# Patient Record
Sex: Female | Born: 1960 | Race: Black or African American | Hispanic: No | Marital: Single | Smoking: Former smoker
Health system: Southern US, Community
[De-identification: ages and names within clinical notes are randomized; demographics above are authoritative.]

## PROBLEM LIST (undated history)

## (undated) ENCOUNTER — Emergency Department (HOSPITAL_COMMUNITY): Discharge status: Against Medical Advice | Payer: PRIVATE HEALTH INSURANCE

## (undated) DIAGNOSIS — K219 Gastro-esophageal reflux disease without esophagitis: Secondary | ICD-10-CM

## (undated) DIAGNOSIS — R112 Nausea with vomiting, unspecified: Secondary | ICD-10-CM

## (undated) DIAGNOSIS — B179 Acute viral hepatitis, unspecified: Secondary | ICD-10-CM

## (undated) DIAGNOSIS — R011 Cardiac murmur, unspecified: Secondary | ICD-10-CM

## (undated) DIAGNOSIS — G932 Benign intracranial hypertension: Secondary | ICD-10-CM

## (undated) DIAGNOSIS — Z9889 Other specified postprocedural states: Secondary | ICD-10-CM

## (undated) DIAGNOSIS — I10 Essential (primary) hypertension: Secondary | ICD-10-CM

## (undated) DIAGNOSIS — J189 Pneumonia, unspecified organism: Secondary | ICD-10-CM

## (undated) DIAGNOSIS — R51 Headache: Secondary | ICD-10-CM

## (undated) DIAGNOSIS — R519 Headache, unspecified: Secondary | ICD-10-CM

## (undated) DIAGNOSIS — M1990 Unspecified osteoarthritis, unspecified site: Secondary | ICD-10-CM

## (undated) DIAGNOSIS — Z87442 Personal history of urinary calculi: Secondary | ICD-10-CM

## (undated) HISTORY — PX: BRAIN SURGERY: SHX531

## (undated) HISTORY — PX: COLECTOMY: SHX59

---
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
## 1986-07-07 HISTORY — PX: CYST EXCISION: SHX5701

## 1992-07-07 HISTORY — PX: TUBAL LIGATION: SHX77

## 1997-07-07 HISTORY — PX: ABDOMINAL HYSTERECTOMY: SHX81

## 2013-10-22 ENCOUNTER — Emergency Department (HOSPITAL_COMMUNITY)
Admission: EM | Admit: 2013-10-22 | Discharge: 2013-10-23 | Disposition: A | Discharge status: Home / Self Care | Payer: PRIVATE HEALTH INSURANCE | Attending: Emergency Medicine | Admitting: Emergency Medicine

## 2013-10-22 ENCOUNTER — Encounter (HOSPITAL_COMMUNITY): Payer: Self-pay | Admitting: Emergency Medicine

## 2013-10-22 DIAGNOSIS — G932 Benign intracranial hypertension: Secondary | ICD-10-CM | POA: Insufficient documentation

## 2013-10-22 DIAGNOSIS — Z7902 Long term (current) use of antithrombotics/antiplatelets: Secondary | ICD-10-CM | POA: Insufficient documentation

## 2013-10-22 DIAGNOSIS — Z9889 Other specified postprocedural states: Secondary | ICD-10-CM | POA: Insufficient documentation

## 2013-10-22 DIAGNOSIS — R51 Headache: Secondary | ICD-10-CM | POA: Insufficient documentation

## 2013-10-22 DIAGNOSIS — Z79899 Other long term (current) drug therapy: Secondary | ICD-10-CM | POA: Insufficient documentation

## 2013-10-22 DIAGNOSIS — R519 Headache, unspecified: Secondary | ICD-10-CM

## 2013-10-22 DIAGNOSIS — Z7982 Long term (current) use of aspirin: Secondary | ICD-10-CM | POA: Insufficient documentation

## 2013-10-22 HISTORY — DX: Benign intracranial hypertension: G93.2

## 2013-10-22 LAB — CBC
HCT: 38.1 % (ref 36.0–46.0)
HEMOGLOBIN: 12.9 g/dL (ref 12.0–15.0)
MCH: 31.1 pg (ref 26.0–34.0)
MCHC: 33.9 g/dL (ref 30.0–36.0)
MCV: 91.8 fL (ref 78.0–100.0)
PLATELETS: 307 10*3/uL (ref 150–400)
RBC: 4.15 MIL/uL (ref 3.87–5.11)
RDW: 13.2 % (ref 11.5–15.5)
WBC: 5.3 10*3/uL (ref 4.0–10.5)

## 2013-10-22 LAB — I-STAT CHEM 8, ED
BUN: 9 mg/dL (ref 6–23)
CHLORIDE: 106 meq/L (ref 96–112)
CREATININE: 0.5 mg/dL (ref 0.50–1.10)
Calcium, Ion: 1.14 mmol/L (ref 1.12–1.23)
Glucose, Bld: 105 mg/dL — ABNORMAL HIGH (ref 70–99)
HCT: 41 % (ref 36.0–46.0)
Hemoglobin: 13.9 g/dL (ref 12.0–15.0)
POTASSIUM: 3.3 meq/L — AB (ref 3.7–5.3)
Sodium: 141 mEq/L (ref 137–147)
TCO2: 22 mmol/L (ref 0–100)

## 2013-10-22 MED ORDER — DEXAMETHASONE SODIUM PHOSPHATE 4 MG/ML IJ SOLN
10.0000 mg | Freq: Once | INTRAMUSCULAR | Status: AC
  Administered 2013-10-22: 10 mg via INTRAVENOUS
  Filled 2013-10-22: qty 3

## 2013-10-22 MED ORDER — METOCLOPRAMIDE HCL 5 MG/ML IJ SOLN
10.0000 mg | Freq: Once | INTRAMUSCULAR | Status: AC
  Administered 2013-10-22: 10 mg via INTRAVENOUS
  Filled 2013-10-22: qty 2

## 2013-10-22 MED ORDER — ACETAZOLAMIDE ER 500 MG PO CP12: 1500.0000 mg | ORAL_CAPSULE | Freq: Two times a day (BID) | ORAL | Status: DC

## 2013-10-22 MED ORDER — DIPHENHYDRAMINE HCL 50 MG/ML IJ SOLN
25.0000 mg | Freq: Once | INTRAMUSCULAR | Status: AC
  Administered 2013-10-22: 25 mg via INTRAVENOUS
  Filled 2013-10-22: qty 1

## 2013-10-22 NOTE — ED Provider Notes (Signed)
CSN: [id number]     Arrival date & time 10/22/13  2035 History   First MD Initiated Contact with Patient 10/22/13 2117     Chief Complaint  Patient presents with  . Headache     (Consider location/radiation/quality/duration/timing/severity/associated sxs/prior Treatment) HPI  This is a 53 y.o. 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 Onset this afternoon. Located right frontal head. Persistent, worsening. Throbbing. Not alleviated with Tylenol. Radiating to right occipital area. Aggravated with ranging of the head. Patient denies vision change, change in speech, fever, rash, weakness, numbness, or tingling, or change in gait.  Past Medical History  Diagnosis Date  . Pseudotumor cerebri    Past Surgical History  Procedure Laterality Date  . Brain surgery     No family history on file. History  Substance Use Topics  . Smoking status: Never Smoker   . Smokeless tobacco: Not on file  . Alcohol Use: No   OB History   Grav Para Term Preterm Abortions TAB SAB Ect Mult Living                 Review of Systems  Constitutional: Negative for fever and chills.  HENT: Negative for facial swelling.   Eyes: Negative for photophobia and pain.  Respiratory: Negative for cough and shortness of breath.   Cardiovascular: Negative for chest pain and leg swelling.  Gastrointestinal: Negative for nausea, vomiting and abdominal pain.  Genitourinary: Negative for dysuria.  Musculoskeletal: Negative for arthralgias.  Skin: Negative for rash and wound.  Neurological: Positive for headaches. Negative for dizziness, seizures, syncope, facial asymmetry, speech difficulty, weakness, light-headedness and numbness.  Hematological: Negative for adenopathy.      Allergies  Cephalosporins; Magnesium-containing compounds; Strawberry;  Verapamil; Aleve; and Codeine  Home Medications   Prior to Admission medications   Medication Sig Start Date End Date Taking? Authorizing Provider  acetaZOLAMIDE (DIAMOX) 500 MG capsule Take 1,500 mg by mouth 2 (two) times daily.   Yes Historical Provider, MD  aspirin 325 MG tablet Take 325 mg by mouth daily.   Yes Historical Provider, MD  candesartan (ATACAND) 16 MG tablet Take 16 mg by mouth daily.   Yes Historical Provider, MD  clopidogrel (PLAVIX) 75 MG tablet Take 75 mg by mouth daily with breakfast.   Yes Historical Provider, MD  levothyroxine (SYNTHROID, LEVOTHROID) 88 MCG tablet Take 88 mcg by mouth daily before breakfast.   Yes Historical Provider, MD   BP 127/80  Pulse 80  Temp(Src) 98.5 F (36.9 C) (Oral)  Resp 18  SpO2 99% Physical Exam  Constitutional: She is oriented to person, place, and time. She appears well-developed and well-nourished. No distress.  HENT:  Head: Normocephalic and atraumatic.  Mouth/Throat: No oropharyngeal exudate.  Eyes: Conjunctivae are normal. Pupils are equal, round, and reactive to light. No scleral icterus.  Neck: Normal range of motion. No tracheal deviation present. No thyromegaly present.  Cardiovascular: Normal rate, regular rhythm and normal heart sounds.  Exam reveals no gallop and no friction rub.   No murmur heard. Pulmonary/Chest: Effort normal and breath sounds normal. No stridor. No respiratory distress. She has no wheezes. She has no rales. She exhibits no tenderness.  Abdominal: Soft. She exhibits no distension and no mass. There is no tenderness. There is no rebound and no guarding.  Musculoskeletal: Normal range of motion. She exhibits no edema.  Neurological: She is alert and oriented to person, place, and time. She has normal strength. She displays no atrophy and no tremor. No cranial nerve deficit or sensory deficit. She exhibits normal muscle tone. She displays no seizure activity. Coordination and gait normal. GCS eye  subscore is 4. GCS verbal subscore is 5. GCS motor subscore is 6.  Reflex Scores:      Patellar reflexes are 2+ on the right side and 2+ on the left side. Skin: Skin is warm and dry. She is not diaphoretic.    ED Course  Procedures (including critical care time) Labs Review Labs Reviewed  I-STAT CHEM 8, ED - Abnormal; Notable for the following:    Potassium 3.3 (*)    Glucose, Bld 105 (*)    All other components within normal limits  CBC    MDM   Final diagnoses:  None    This is a 53 y.o. 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 Onset this afternoon. Located right frontal head. Persistent, worsening. Throbbing. Not alleviated with Tylenol. Radiating to right occipital area. Aggravated with ranging of the head. Patient denies vision change, change in speech, fever, rash, weakness, numbness, or tingling, or change in gait.  Examination is within normal limits. Specifically, patient has no meningeal signs. She has no neuro deficits. She states that she has been on Plavix, aspirin since stent placement. Physicians have instructed her not to receive lumbar puncture while she is on these medications. Physicians have instructed her to "bear it" when she has headaches. She is scheduled to [facility] in 3 weeks to discuss placement of shunt.  At this time, I do not believe that headache represents meningitis, intracranial hemorrhage, thrombosis, dissection, aneurysm, or any other concerning etiology. She has no indication for emergent lumbar puncture at this time. Will administer Reglan, Benadryl, Decadron, continue to monitor patient closely, reevaluate shortly. The patient remains stable.  Patient endorses alleviation, but not resolution. She is now revealed to me that she has not been taking her Diamox  for 3 weeks. She is unable to afford the prescription (she states it is $600 for a month's prescription). I believe that this is likely part of the explanation of patient's worsening headache. I called social work for assistance in providing patient with resources and financial assistance with her prescription.  They have referred me to care management. I have spoken with Raynelle FanningJulie care management who will arrange for match letter to be [facility] [address] tomorrow morning. I provided the patient with another prescription for Diamox indicates that she no longer has hers. I've instructed her to present to the [facility] midday tomorrow in order to fill her prescription.  Pt stable for discharge, FU.  All questions answered.  Return precautions given.  I have discussed case and care has been guided by my attending physician, Dr. Fonnie JarvisBednar.    Loma BostonStirling Geselle Cardosa, MD 10/23/13 [phone]

## 2013-10-22 NOTE — ED Notes (Signed)
Patient with headache since this morning.  Patient states that it has been getting worse during the day.  Patient is very tearful in triage.  Patient states she did have brain surgery 6 months ago for a stent placement.  Patient denies any nausea or vomiting at this time.

## 2013-10-22 NOTE — ED Notes (Signed)
PA at bedside.

## 2013-10-22 NOTE — Discharge Instructions (Signed)
THE MATCH LETTER IS BEING FAXED TO THE [facility] ON [address].  PRESENT THERE MID DAY TOMORROW TO FILL PRESCRIPTION.  Idiopathic Intracranial Hypertension  Idiopathic intracranial hypertension (IIH) is a neurologic disorder that leads to increased pressure around your brain. It can cause vision loss and blindness if left untreated. RISK FACTORS IIH is most common in very overweight (obese) women of childbearing age. SIGNS AND SYMPTOMS  Symptoms of IIH include:  Headache.  Feeling of sickness in your stomach (nausea).  Vomiting.  A "rushing of water" sound within your ears (pulsatile tinnitus).  Double vision. DIAGNOSIS  Idiopathic intracranial hypertension is diagnosed with the aid of different exams:  Brain scans such as:  CT.  MRI.  MRV.  Diagnostic lumbar puncture. This procedure can determine if there is too much spinal fluid within the central nervous system. Too much spinal fluid can increase intracranial pressure.  A thorough eye exam will be done to look for swelling within the eyes. Visual field testing will also be done to see if any damage has occurred to nerves in the eyes. TREATMENT  Treatment of idiopathic intracranial hypertension is based on symptoms. Common treatments include:  Lumbar puncture to remove excess spinal fluid.  Medicine.  Surgery. HOME CARE INSTRUCTIONS: The most important thing anyone can do to improve this condition is lose weight if they are overweight.  SEEK MEDICAL CARE IF:  You have changes in vision.  You have double vision.  You have loss of color vision. SEEK IMMEDIATE MEDICAL CARE IF:  You experience any of the following, such as:  Your headaches get worse rather than better.  Nausea or vomiting or both continue after treatment.  Your vision does not improve or gets worse after treatment. MAKE SURE YOU:  Understand these instructions.  Will watch your condition.  Will get help right away if you are not doing well or  get worse. Document Released: 09/01/2001 Document Revised: 04/13/2013 Document Reviewed: 02/28/2013 [facility] Patient Information 2014 [employer] [place].

## 2013-10-23 NOTE — Progress Notes (Signed)
Case Manager spoke with Floor CM Freddy JakschSarah Jeffries regarding patients prescription.[facility] reports the cost of the Diamox medication exceeds the [facility].  CM spoke with Dr Rubin PayorPickering regarding the order no changes were able to be made.CM liaised  with MD regarding patients plan.CM to call pharmacist to see if they can do a partial Medication assist today using patients MATCH, pharmacist reports per [facility] she can cover 120 tablets for $495.CM clarified no further prescriptions were needed. Katrina Pharmacist reports she has the Diamox prescription and had no other needs.

## 2013-10-23 NOTE — Progress Notes (Signed)
CARE MANAGEMENT NOTE 10/23/2013  Patient:  Theresa Case,Theresa Case   Account Number:  [id number]  Date Initiated:  10/23/2013  Documentation initiated by:  [facility]Levy Cedano  Subjective/Objective Assessment:   ED     Action/Plan:   med asst   Anticipated DC Date:     Anticipated DC Plan:           Choice offered to / List presented to:             Status of service:  Completed, signed off Medicare Important Message given?   (If response is "NO", the following Medicare IM given date fields will be blank) Date Medicare IM given:   Date Additional Medicare IM given:    Discharge Disposition:  HOME/SELF CARE  Per UR Regulation:    If discussed at Long Length of Stay Meetings, dates discussed:    Comments:  10/23/1513:45 CM received call from pharmacist, Katrina, who states MATCH is rejected.  CM verifies all numbers with no errors.  CM speaks with pt who states she actually has insurance [employer] PPO but it is too expensive.  CM suggests she works with the pharmacist, Katrina to see how many capsules of the med she can afford and use her insurance.  CM suggests [facility] appt for a PCP.  And of course if she thinks it an emergent situation, return to the ED. No other CM needs were communicated.  Freddy JakschSarah Aeriana Speece, BSN, Cm [phone]. 13:15 CM called pt as pt called the ED and stated pharmacy didn't get the right information.  While the pt was on the phone, CM called the pharmacy, verified the information, verified the pharmacy address, and pharmacy states they have not heard from pt.  Pt states maybe she called the wrong one.  Pt verbalized understanding of MATCH parameters and understands which pharmacy she is to bring her prescriptions to today.  No other CM needs were communicated.  Freddy JakschSarah Kimmy Totten, BSN, CM [phone].  08:30 CM received call from on-call CM to [facility] and [facility] on [address] for pt to pick up meds later  this afternoon.  CM MATCHed pt and called into pharmacy, Barbara CowerJason.  No other CM needs were communicated.  Freddy JakschSarah Tattianna Schnarr, BSN, CM [phone].

## 2013-10-24 NOTE — Care Management Note (Addendum)
    Page 1 of 1   10/23/2013     4:06:00 PM CARE MANAGEMENT NOTE 10/23/2013  Patient:  Theresa Case,Theresa Case   Account Number:  [id number]  Date Initiated:  10/23/2013  Documentation initiated by:  [facility]Katoria Yetman  Subjective/Objective Assessment:   ED     Action/Plan:   med asst   Anticipated DC Date:     Anticipated DC Plan:           Choice offered to / List presented to:             Status of service:  Completed, signed off Medicare Important Message given?   (If response is "NO", the following Medicare IM given date fields will be blank) Date Medicare IM given:   Date Additional Medicare IM given:    Discharge Disposition:  HOME/SELF CARE  Per UR Regulation:    If discussed at Long Length of Stay Meetings, dates discussed:    Comments:  10/23/1513:45 CM received call from pharmacist, Katrina, who states MATCH is rejected.  CM verifies all numbers with no errors.  CM speaks with pt who states she actually has insurance [employer] [employer] PPO but it is too expensive.  CM suggests she works with the pharmacist, Katrina to see how many capsules of the med she can afford and use her insurance.  CM suggests she [facility] to [facility] an appt for a PCP.  And of course if she thinks it an emergent situation, return to the ED. No other CM needs were communicated.  Freddy JakschSarah Kamyia Thomason, BSN, Cm [phone]. 13:15 CM called pt as pt called the ED and stated pharmacy didn't get the right information.  While the pt was on the phone, CM called the pharmacy, verified the information, verified the pharmacy address, and pharmacy states they have not heard from pt.  Pt states maybe she called the wrong one.  Pt verbalized understanding of MATCH parameters and understands which pharmacy she is to bring her prescriptions to today.  No other CM needs were communicated.  Freddy JakschSarah Nizar Cutler, BSN, CM [phone].  08:30 CM received call from on-call CM to [facility] and [facility] [address] for pt to pick up meds later this afternoon.  CM MATCHed pt and called into pharmacy, Barbara CowerJason.  No other CM needs were communicated.  Freddy JakschSarah Mandisa Persinger, BSN, CM [phone].

## 2013-10-27 NOTE — ED Provider Notes (Signed)
I saw and evaluated the patient, reviewed the resident's note and I agree with the findings and plan.   EKG Interpretation None     No vision change or fever; has been instructed no LP due to meds.  Hurman HornJohn M Aveon Colquhoun, MD 10/27/13 [phone]

## 2014-12-10 DIAGNOSIS — E039 Hypothyroidism, unspecified: Secondary | ICD-10-CM | POA: Insufficient documentation

## 2014-12-10 DIAGNOSIS — G4733 Obstructive sleep apnea (adult) (pediatric): Secondary | ICD-10-CM | POA: Insufficient documentation

## 2014-12-10 DIAGNOSIS — IMO0001 Reserved for inherently not codable concepts without codable children: Secondary | ICD-10-CM | POA: Insufficient documentation

## 2016-07-01 ENCOUNTER — Emergency Department (HOSPITAL_COMMUNITY): Payer: 59

## 2016-07-01 ENCOUNTER — Emergency Department (HOSPITAL_COMMUNITY)
Admission: EM | Admit: 2016-07-01 | Discharge: 2016-07-01 | Disposition: A | Discharge status: Home / Self Care | Payer: 59 | Attending: Emergency Medicine | Admitting: Emergency Medicine

## 2016-07-01 ENCOUNTER — Encounter (HOSPITAL_COMMUNITY): Payer: Self-pay | Admitting: Emergency Medicine

## 2016-07-01 DIAGNOSIS — Z7982 Long term (current) use of aspirin: Secondary | ICD-10-CM | POA: Diagnosis not present

## 2016-07-01 DIAGNOSIS — N23 Unspecified renal colic: Secondary | ICD-10-CM | POA: Diagnosis not present

## 2016-07-01 DIAGNOSIS — R1012 Left upper quadrant pain: Secondary | ICD-10-CM | POA: Diagnosis present

## 2016-07-01 DIAGNOSIS — R109 Unspecified abdominal pain: Secondary | ICD-10-CM

## 2016-07-01 LAB — COMPREHENSIVE METABOLIC PANEL
ALK PHOS: 55 U/L (ref 38–126)
ALT: 52 U/L (ref 14–54)
ANION GAP: 11 (ref 5–15)
AST: 36 U/L (ref 15–41)
Albumin: 4.1 g/dL (ref 3.5–5.0)
BUN: 9 mg/dL (ref 6–20)
CALCIUM: 9.6 mg/dL (ref 8.9–10.3)
CO2: 17 mmol/L — AB (ref 22–32)
Chloride: 110 mmol/L (ref 101–111)
Creatinine, Ser: 0.75 mg/dL (ref 0.44–1.00)
Glucose, Bld: 105 mg/dL — ABNORMAL HIGH (ref 65–99)
Potassium: 3.7 mmol/L (ref 3.5–5.1)
SODIUM: 138 mmol/L (ref 135–145)
TOTAL PROTEIN: 7.4 g/dL (ref 6.5–8.1)
Total Bilirubin: 0.5 mg/dL (ref 0.3–1.2)

## 2016-07-01 LAB — CBC
HCT: 39.2 % (ref 36.0–46.0)
Hemoglobin: 13 g/dL (ref 12.0–15.0)
MCH: 30.6 pg (ref 26.0–34.0)
MCHC: 33.2 g/dL (ref 30.0–36.0)
MCV: 92.2 fL (ref 78.0–100.0)
PLATELETS: 302 10*3/uL (ref 150–400)
RBC: 4.25 MIL/uL (ref 3.87–5.11)
RDW: 14.1 % (ref 11.5–15.5)
WBC: 8.9 10*3/uL (ref 4.0–10.5)

## 2016-07-01 LAB — URINALYSIS, ROUTINE W REFLEX MICROSCOPIC
Bilirubin Urine: NEGATIVE
Glucose, UA: NEGATIVE mg/dL
Hgb urine dipstick: NEGATIVE
Ketones, ur: NEGATIVE mg/dL
Leukocytes, UA: NEGATIVE
NITRITE: NEGATIVE
PROTEIN: NEGATIVE mg/dL
Specific Gravity, Urine: 1.006 (ref 1.005–1.030)
pH: 9 — ABNORMAL HIGH (ref 5.0–8.0)

## 2016-07-01 LAB — LIPASE, BLOOD: Lipase: 18 U/L (ref 11–51)

## 2016-07-01 IMAGING — CT CT ABD-PELV W/ CM
2 of 5 series · 16 of 46 positions shown, 18 images · IV contrast (Omni 300)
Comparison: [DATE]

CLINICAL DATA: Left-sided abdominal pain for several hours, initial
encounter

EXAM:
CT ABDOMEN AND PELVIS WITH CONTRAST
TECHNIQUE: Multidetector CT imaging of the abdomen and pelvis was performed
using the standard protocol following bolus administration of
intravenous contrast.
CONTRAST:  100 mL [8Y].

[Series 2: a/p w/ 5mm · axial · 0.88mm/px · z∈[+726,+1141]mm · 13 of 95 slices shown, 15 images]
[im 6/95  soft-tissue]
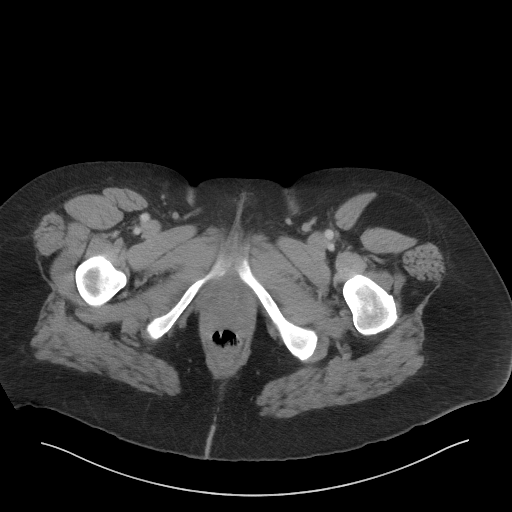
[im 6/95  bone]
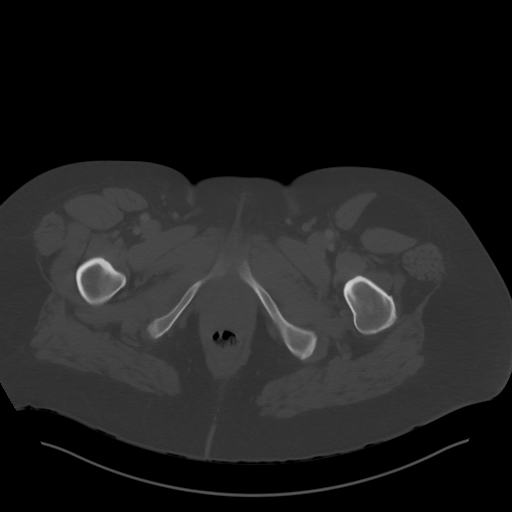
[im 12/95  soft-tissue]
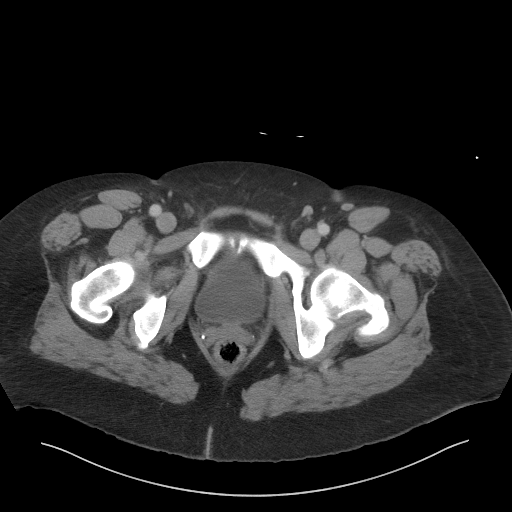
[im 23/95  soft-tissue]
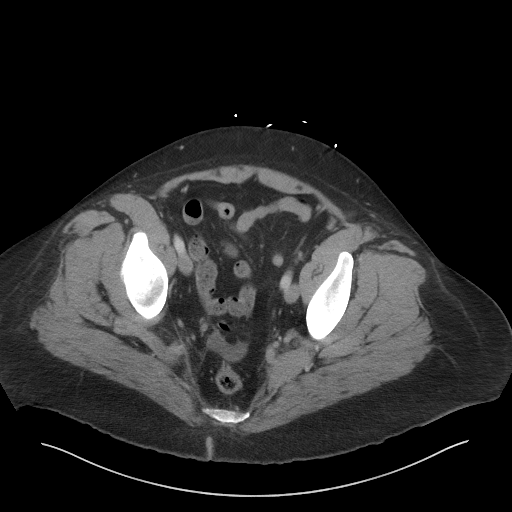
[im 28/95  soft-tissue]
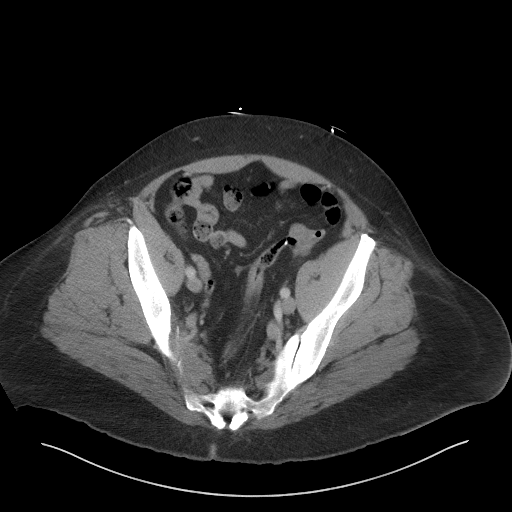
[im 34/95  soft-tissue]
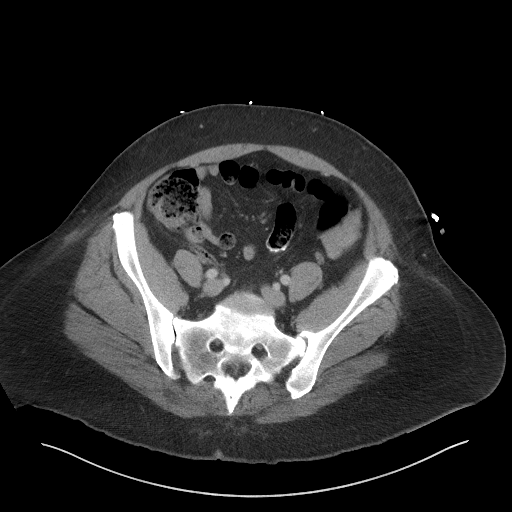
[im 39/95  soft-tissue]
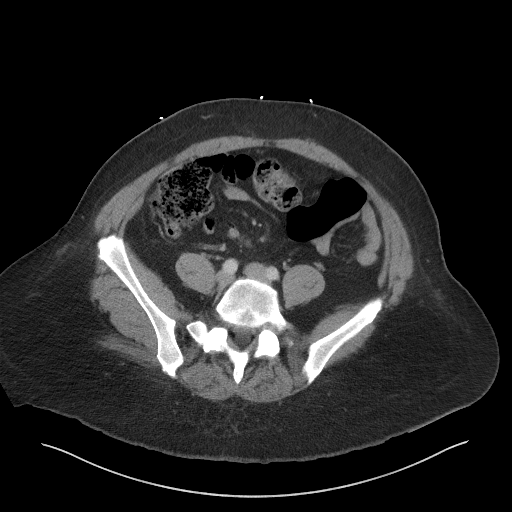
[im 50/95  soft-tissue]
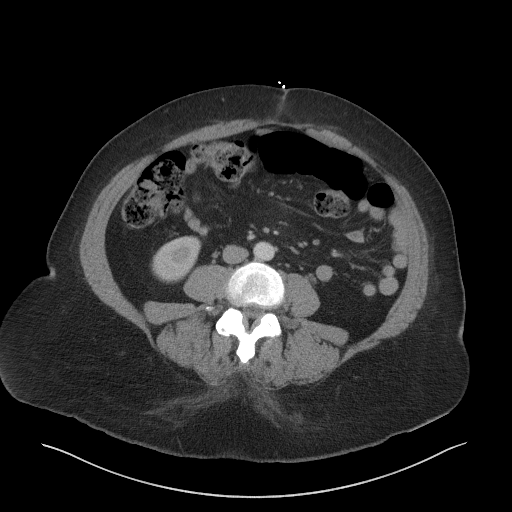
[im 56/95  soft-tissue]
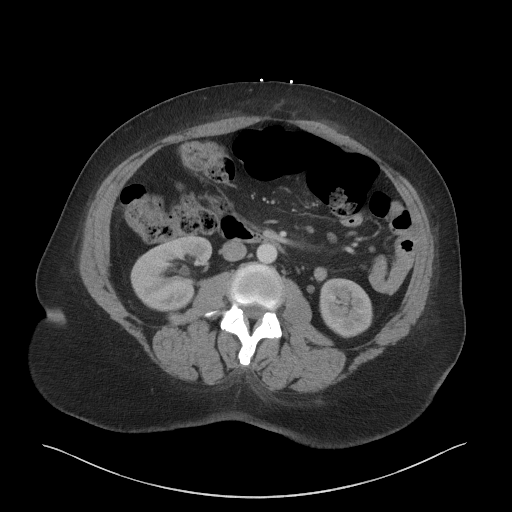
[im 61/95  soft-tissue]
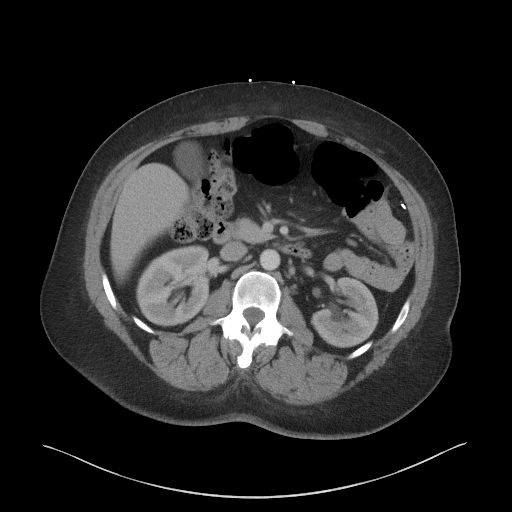
[im 61/95  bone]
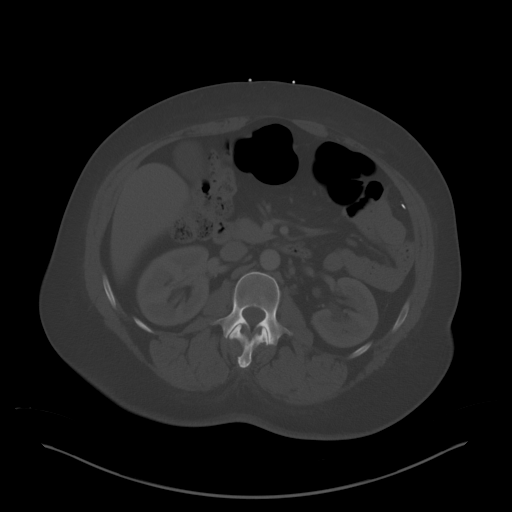
[im 67/95  soft-tissue]
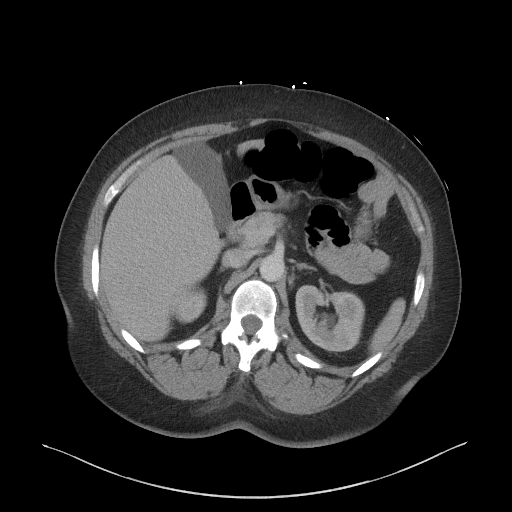
[im 72/95  soft-tissue]
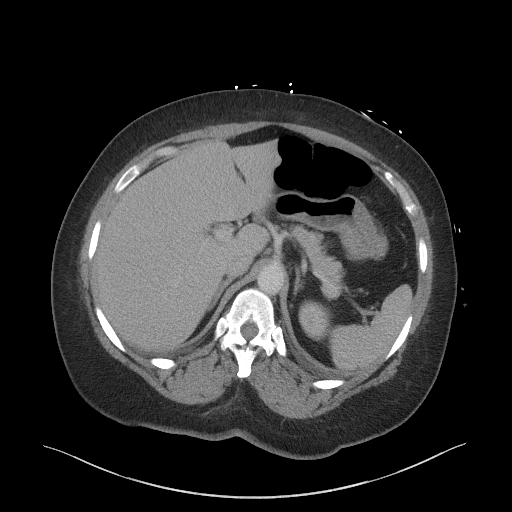
[im 83/95  soft-tissue]
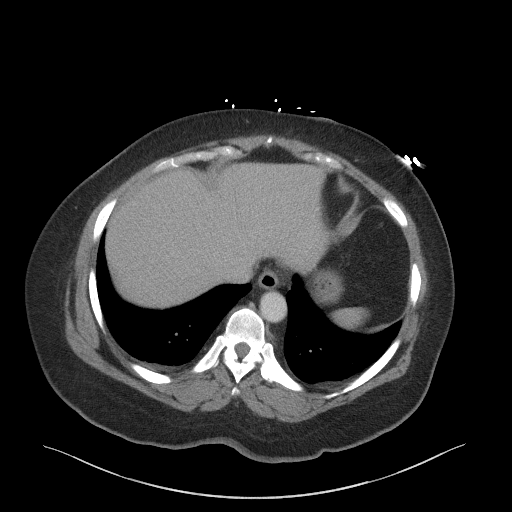
[im 89/95  soft-tissue]
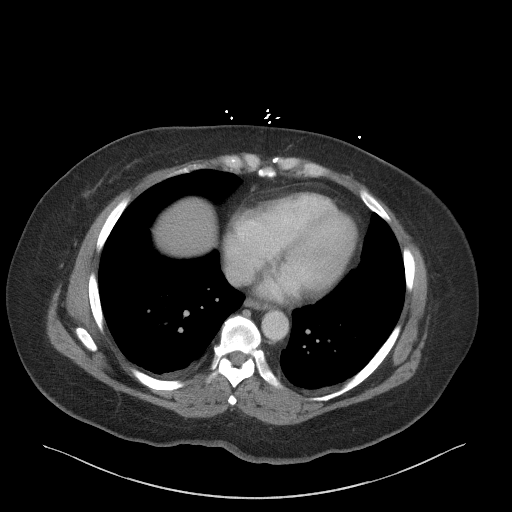

[Series 5: a/p w/ cor · coronal · 0.88mm/px · 3 of 162 slices shown]
[im 54/162  soft-tissue]
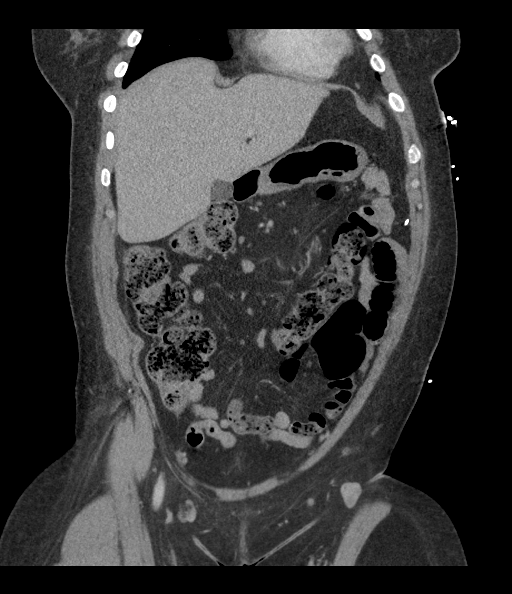
[im 72/162  soft-tissue]
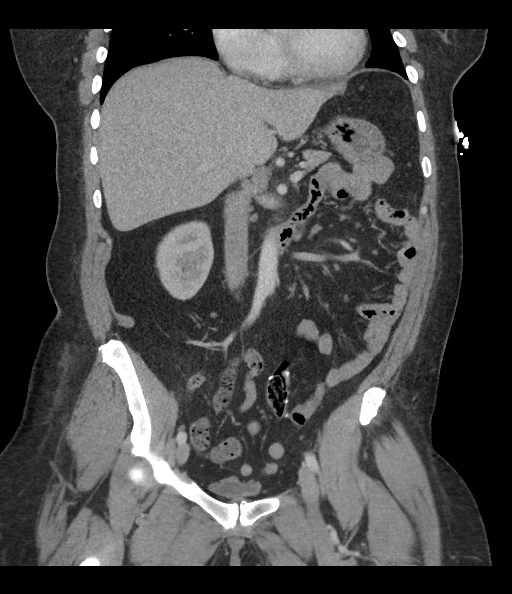
[im 90/162  soft-tissue]
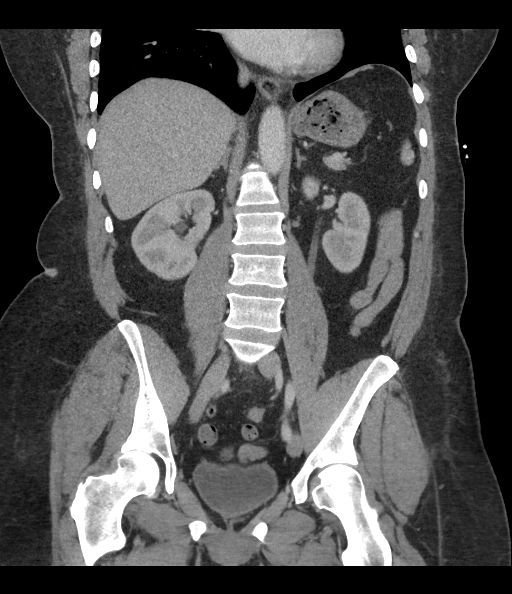

[16 of 46 positions shown; findings below may reference images not displayed]

FINDINGS: Lower chest: Minimal atelectatic changes are noted bilaterally.

Hepatobiliary: No focal liver abnormality is seen. No gallstones,
gallbladder wall thickening, or biliary dilatation.

Pancreas: Unremarkable. No pancreatic ductal dilatation or
surrounding inflammatory changes.

Spleen: Normal in size without focal abnormality.

Adrenals/Urinary Tract: The adrenal glands are within normal limits.
The right kidney is unremarkable. No definitive renal calculi are
seen. Minimal fullness of the left collecting system is noted
without definitive obstructing stone. This may be related to edema
from recently passed stone. Bladder is well distended

Stomach/Bowel: Stomach is within normal limits. Appendix appears
normal. No evidence of bowel wall thickening, distention, or
inflammatory changes. Prior sigmoid surgery is noted. The
anastomotic site is widely patent.

Vascular/Lymphatic: No significant vascular findings are present. No
enlarged abdominal or pelvic lymph nodes.

Reproductive: Status post hysterectomy. No adnexal masses.

Other: No abdominal wall hernia or abnormality. No abdominopelvic
ascites.

Musculoskeletal: No acute or significant osseous findings.
IMPRESSION: Minimal fullness of the left collecting system. Given the patient's
history this may be related to edema from recently passed stone.

No other focal abnormality is noted.

## 2016-07-01 MED ORDER — ONDANSETRON HCL 4 MG/2ML IJ SOLN
4.0000 mg | Freq: Once | INTRAMUSCULAR | Status: AC
  Administered 2016-07-01: 4 mg via INTRAVENOUS
  Filled 2016-07-01: qty 2

## 2016-07-01 MED ORDER — TRAMADOL HCL 50 MG PO TABS: 50.0000 mg | ORAL_TABLET | Freq: Four times a day (QID) | ORAL | 0 refills | Status: DC | PRN

## 2016-07-01 MED ORDER — KETOROLAC TROMETHAMINE 30 MG/ML IJ SOLN
30.0000 mg | Freq: Once | INTRAMUSCULAR | Status: AC
  Administered 2016-07-01: 30 mg via INTRAVENOUS
  Filled 2016-07-01: qty 1

## 2016-07-01 MED ORDER — KETOROLAC TROMETHAMINE 15 MG/ML IJ SOLN
15.0000 mg | Freq: Once | INTRAMUSCULAR | Status: AC
  Administered 2016-07-01: 15 mg via INTRAVENOUS
  Filled 2016-07-01: qty 1

## 2016-07-01 MED ORDER — IOPAMIDOL (ISOVUE-300) INJECTION 61%
INTRAVENOUS | Status: AC
  Administered 2016-07-01: 100 mL
  Filled 2016-07-01: qty 100

## 2016-07-01 MED ORDER — MORPHINE SULFATE (PF) 4 MG/ML IV SOLN
4.0000 mg | Freq: Once | INTRAVENOUS | Status: AC
  Administered 2016-07-01: 4 mg via INTRAVENOUS
  Filled 2016-07-01: qty 1

## 2016-07-01 MED ORDER — DIPHENHYDRAMINE HCL 50 MG/ML IJ SOLN
25.0000 mg | Freq: Once | INTRAMUSCULAR | Status: AC
  Administered 2016-07-01: 25 mg via INTRAVENOUS
  Filled 2016-07-01: qty 1

## 2016-07-01 MED ORDER — LORAZEPAM 2 MG/ML IJ SOLN
1.0000 mg | Freq: Once | INTRAMUSCULAR | Status: AC
  Administered 2016-07-01: 1 mg via INTRAVENOUS
  Filled 2016-07-01: qty 1

## 2016-07-01 MED ORDER — ONDANSETRON 4 MG PO TBDP: 4.0000 mg | ORAL_TABLET | Freq: Three times a day (TID) | ORAL | 0 refills | Status: DC | PRN

## 2016-07-01 NOTE — ED Triage Notes (Signed)
Patient woke up this morning with mid/LUQ pain . Denies emesis or diarrhea , no fever or chills .

## 2016-07-01 NOTE — ED Provider Notes (Signed)
[facility]-EMERGENCY DEPT Provider Note   CSN: [id number] Arrival date & time: 07/01/16  0601     History   Chief Complaint Chief Complaint  Patient presents with  . Abdominal Pain    HPI Djeneba Felix AhmadiStaten is a 55 y.o. female.  The history is provided by the patient and medical records.    55 year old female with history of pseudotumor cerebri, presenting to the ED for abdominal pain. Reports this began early this morning and woke her from sleep. Pain is localized to her mid and upper left abdomen. States pain is severe. States it "feels like she swallowed a basketball". She's not had a bowel movement in a few days but reports this is not necessarily abnormal for her. She's not had any nausea or vomiting. No abnormal food intake. No recent travel.  Prior abdominal surgeries include partial colectomy and hysterectomy. She denies any current headache, neck pain, or fever. No urinary symptoms. No pelvic complaints.  Past Medical History:  Diagnosis Date  . Pseudotumor cerebri     There are no active problems to display for this patient.   Past Surgical History:  Procedure Laterality Date  . BRAIN SURGERY      OB History    No data available       Home Medications    Prior to Admission medications   Medication Sig Start Date End Date Taking? Authorizing Provider  acetaZOLAMIDE (DIAMOX) 500 MG capsule Take 1,500 mg by mouth 2 (two) times daily.    Historical Provider, MD  acetaZOLAMIDE (DIAMOX) 500 MG capsule Take 3 capsules (1,500 mg total) by mouth 2 (two) times daily. 10/22/13   Loma BostonStirling Harper, MD  aspirin 325 MG tablet Take 325 mg by mouth daily.    Historical Provider, MD  candesartan (ATACAND) 16 MG tablet Take 16 mg by mouth daily.    Historical Provider, MD  clopidogrel (PLAVIX) 75 MG tablet Take 75 mg by mouth daily with breakfast.    Historical Provider, MD  levothyroxine (SYNTHROID, LEVOTHROID) 88 MCG tablet Take 88 mcg by mouth daily before breakfast.    Historical  Provider, MD    Family History No family history on file.  Social History Social History  Substance Use Topics  . Smoking status: Never Smoker  . Smokeless tobacco: Never Used  . Alcohol use No     Allergies   Cephalosporins; Magnesium-containing compounds; Strawberry extract; Verapamil; Aleve [naproxen]; and Codeine   Review of Systems Review of Systems  Gastrointestinal: Positive for abdominal pain.  All other systems reviewed and are negative.    Physical Exam Updated Vital Signs BP 140/91 (BP Location: Right Arm)   Pulse 71   Temp 98.8 F (37.1 C) (Oral)   Resp 22   SpO2 99%   Physical Exam  Constitutional: She is oriented to person, place, and time. She appears well-developed and well-nourished.  Appears uncomfortable, moaning during exam  HENT:  Head: Normocephalic and atraumatic.  Mouth/Throat: Oropharynx is clear and moist.  Eyes: Conjunctivae and EOM are normal. Pupils are equal, round, and reactive to light.  Neck: Normal range of motion.  Cardiovascular: Normal rate, regular rhythm and normal heart sounds.   Pulmonary/Chest: Effort normal and breath sounds normal.  Abdominal: Soft. Bowel sounds are normal. There is tenderness in the left upper quadrant and left lower quadrant.  Pain with any palpation on left side of abdomen  Musculoskeletal: Normal range of motion.  Neurological: She is alert and oriented to person, place, and time.  Skin: Skin  is warm and dry.  Psychiatric: She has a normal mood and affect.  Nursing note and vitals reviewed.    ED Treatments / Results  Labs (all labs ordered are listed, but only abnormal results are displayed) Labs Reviewed  COMPREHENSIVE METABOLIC PANEL - Abnormal; Notable for the following:       Result Value   CO2 17 (*)    Glucose, Bld 105 (*)    All other components within normal limits  URINALYSIS, ROUTINE W REFLEX MICROSCOPIC - Abnormal; Notable for the following:    Color, Urine COLORLESS (*)     pH 9.0 (*)    All other components within normal limits  LIPASE, BLOOD  CBC    EKG  EKG Interpretation None       Radiology Ct Abdomen Pelvis W Contrast  Result Date: 07/01/2016 CLINICAL DATA:  Left-sided abdominal pain for several hours, initial encounter EXAM: CT ABDOMEN AND PELVIS WITH CONTRAST TECHNIQUE: Multidetector CT imaging of the abdomen and pelvis was performed using the standard protocol following bolus administration of intravenous contrast. CONTRAST:  100 mL Isovue-300. COMPARISON:  04/25/2015 FINDINGS: Lower chest: Minimal atelectatic changes are noted bilaterally. Hepatobiliary: No focal liver abnormality is seen. No gallstones, gallbladder wall thickening, or biliary dilatation. Pancreas: Unremarkable. No pancreatic ductal dilatation or surrounding inflammatory changes. Spleen: Normal in size without focal abnormality. Adrenals/Urinary Tract: The adrenal glands are within normal limits. The right kidney is unremarkable. No definitive renal calculi are seen. Minimal fullness of the left collecting system is noted without definitive obstructing stone. This may be related to edema from recently passed stone. Bladder is well distended Stomach/Bowel: Stomach is within normal limits. Appendix appears normal. No evidence of bowel wall thickening, distention, or inflammatory changes. Prior sigmoid surgery is noted. The anastomotic site is widely patent. Vascular/Lymphatic: No significant vascular findings are present. No enlarged abdominal or pelvic lymph nodes. Reproductive: Status post hysterectomy. No adnexal masses. Other: No abdominal wall hernia or abnormality. No abdominopelvic ascites. Musculoskeletal: No acute or significant osseous findings. IMPRESSION: Minimal fullness of the left collecting system. Given the patient's history this may be related to edema from recently passed stone. No other focal abnormality is noted. Electronically Signed   By: Alcide CleverMark  Lukens M.D.   On:  07/01/2016 09:20    Procedures Procedures (including critical care time)  Medications Ordered in ED Medications  morphine 4 MG/ML injection 4 mg (4 mg Intravenous Given 07/01/16 0708)  ondansetron (ZOFRAN) injection 4 mg (4 mg Intravenous Given 07/01/16 0708)  diphenhydrAMINE (BENADRYL) injection 25 mg (25 mg Intravenous Given 07/01/16 0741)  ketorolac (TORADOL) 15 MG/ML injection 15 mg (15 mg Intravenous Given 07/01/16 0742)  LORazepam (ATIVAN) injection 1 mg (1 mg Intravenous Given 07/01/16 0741)  iopamidol (ISOVUE-300) 61 % injection (100 mLs  Contrast Given 07/01/16 0841)  ketorolac (TORADOL) 30 MG/ML injection 30 mg (30 mg Intravenous Given 07/01/16 1016)     Initial Impression / Assessment and Plan / ED Course  I have reviewed the triage vital signs and the nursing notes.  Pertinent labs & imaging results that were available during my care of the patient were reviewed by me and considered in my medical decision making (see chart for details).  Clinical Course    55 year old female here with left-sided abdominal pain, woke her from sleep this morning. She is afebrile and nontoxic. She does appear uncomfortable. Severe tenderness to any sort of palpation to the left side of her abdomen on exam.  Screening labwork is overall reassuring.  CT scan obtained, some fullness of the left renal collecting system with suggestion of recently passed stone. After patient's pain was better controlled, she did tell me that she urinated out what appeared to be "small grains of sand" earlier this morning. This is likely fragments of stone that she passed.  Patient's pain has been adequately controlled here. She has been able to move about without difficulty.  Will plan to discharge home with symptom control.  PCP follow-up encouraged.  Discussed plan with patient, she acknowledged understanding and agreed with plan of care.  Return precautions given for new or worsening symptoms.  Final Clinical  Impressions(s) / ED Diagnoses   Final diagnoses:  Renal colic  Abdominal pain, unspecified abdominal location    New Prescriptions Discharge Medication List as of 07/01/2016 11:55 AM    START taking these medications   Details  ondansetron (ZOFRAN ODT) 4 MG disintegrating tablet Take 1 tablet (4 mg total) by mouth every 8 (eight) hours as needed for nausea., Starting Tue 07/01/2016, Print    traMADol (ULTRAM) 50 MG tablet Take 1 tablet (50 mg total) by mouth every 6 (six) hours as needed., Starting Tue 07/01/2016, Print         Garlon Hatchet, PA-C 07/01/16 1458    Shon Baton, MD 07/07/16 2251

## 2016-07-01 NOTE — ED Notes (Signed)
Patient transported to CT 

## 2016-07-01 NOTE — ED Notes (Signed)
Papers reviewed with patient and she sts that she can't take tramadol for pain because it makes her very sick. Spoke with PA to about new medications and got it straightened out.

## 2016-07-01 NOTE — Discharge Instructions (Signed)
Take the prescribed medication as directed.  Tylenol can be taken with tramadol for added relief if needed. Follow-up with your primary care doctor. Return to the ED for new or worsening symptoms.

## 2016-07-01 NOTE — ED Notes (Signed)
Lisa PA at bedside

## 2016-10-08 IMAGING — US ULTRASOUND ABDOMEN LIMITED
1 series · 14 of 25 positions shown · non-contrast
Comparison: None.

CLINICAL DATA: Right upper quadrant pain

EXAM:
ULTRASOUND ABDOMEN LIMITED RIGHT UPPER QUADRANT

[Series 1: ultrasound abdomen limited · 0.26mm/px · 14 of 26 slices shown]
[im 1/26]
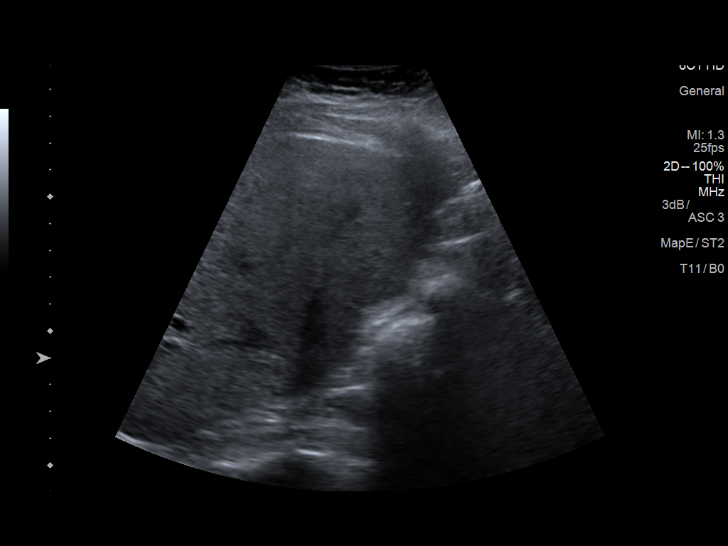
[im 3/26]
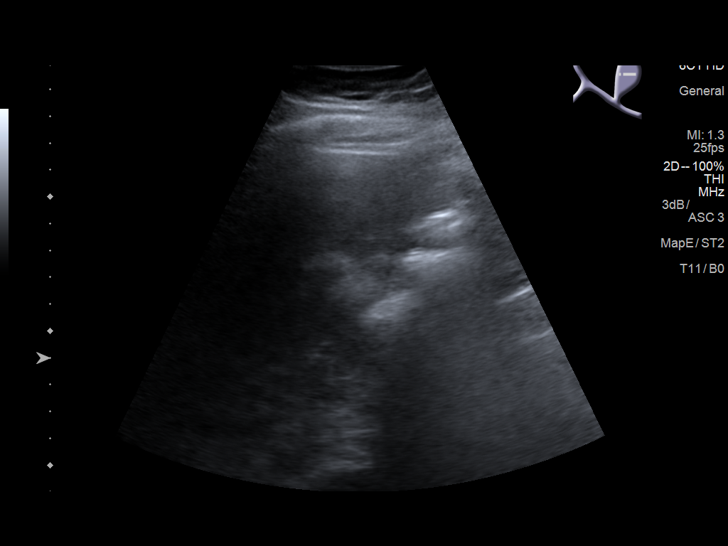
[im 5/26]
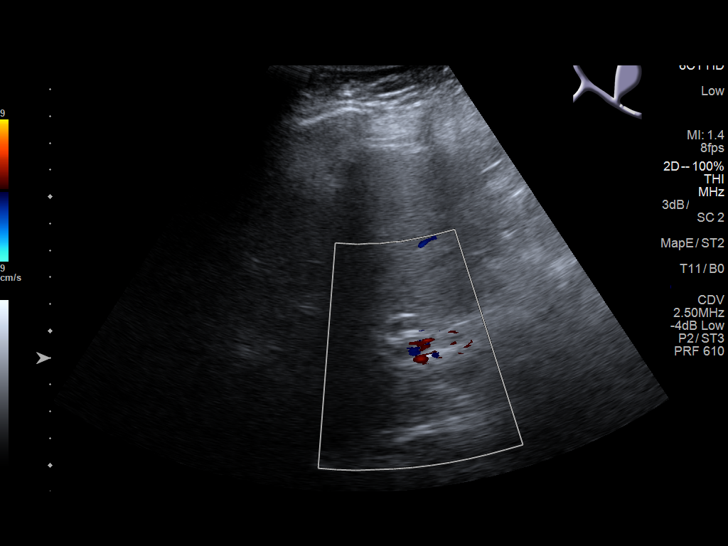
[im 7/26]
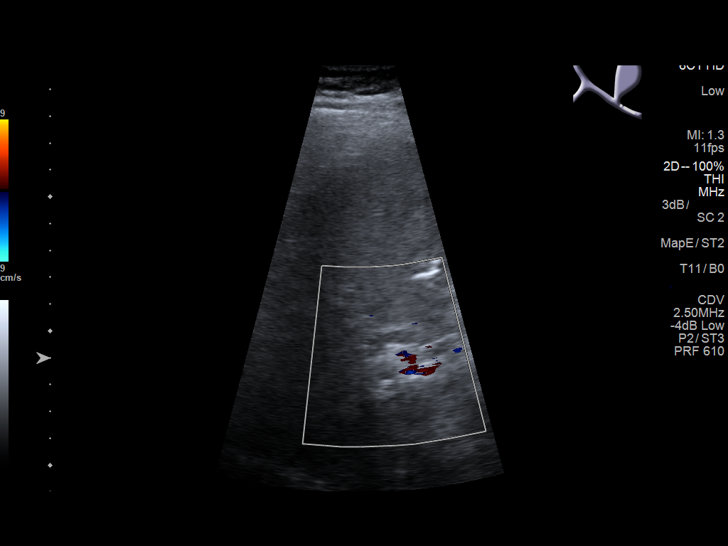
[im 9/26]
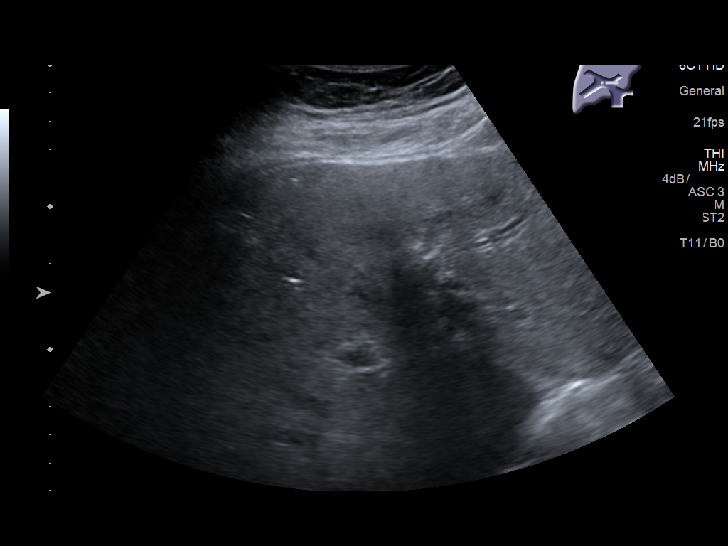
[im 10/26]
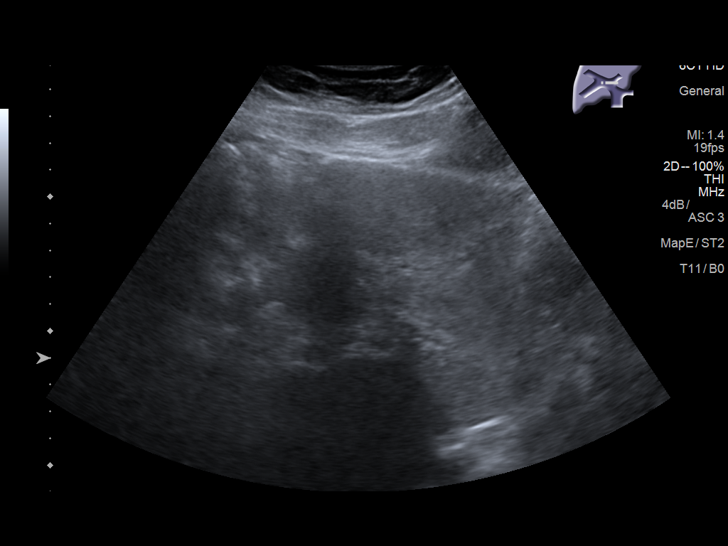
[im 12/26]
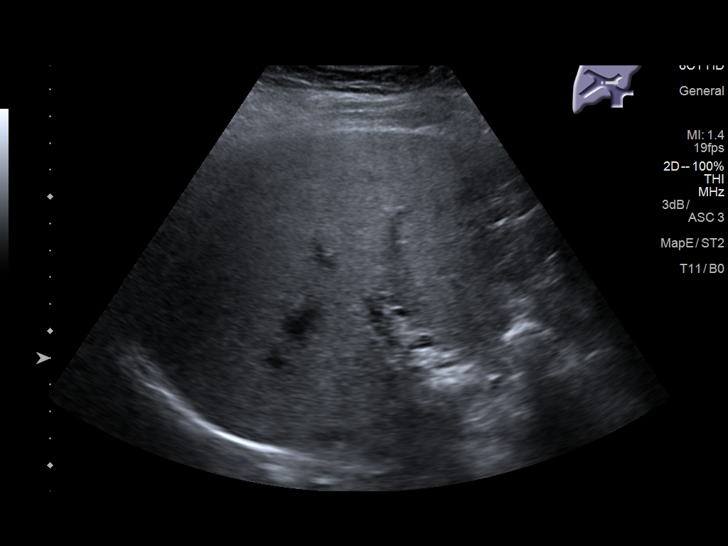
[im 14/26]
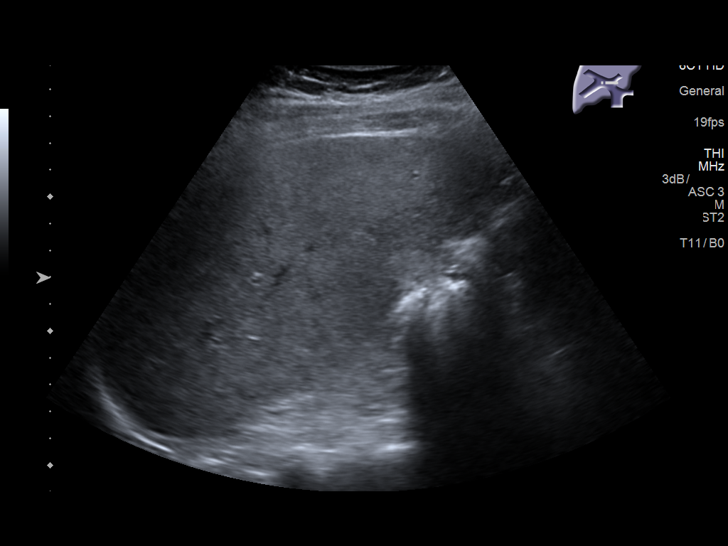
[im 16/26]
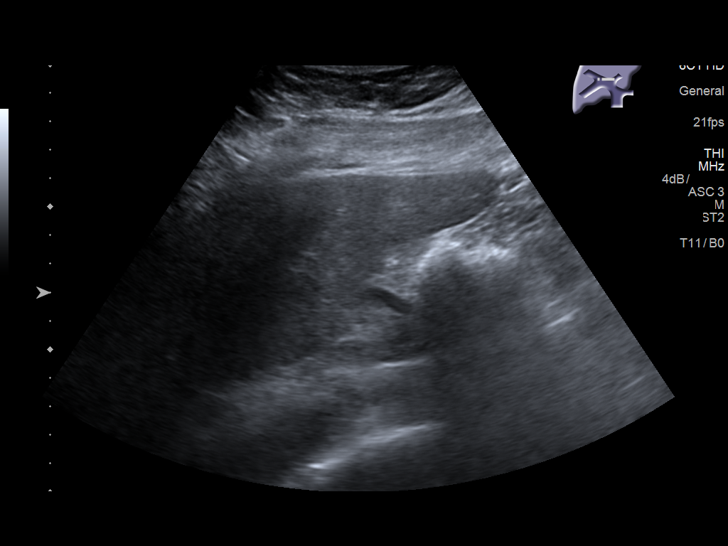
[im 17/26]
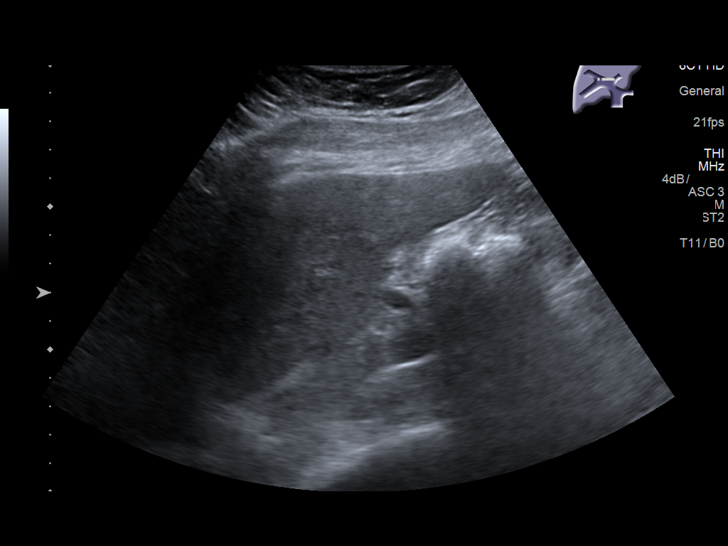
[im 19/26]
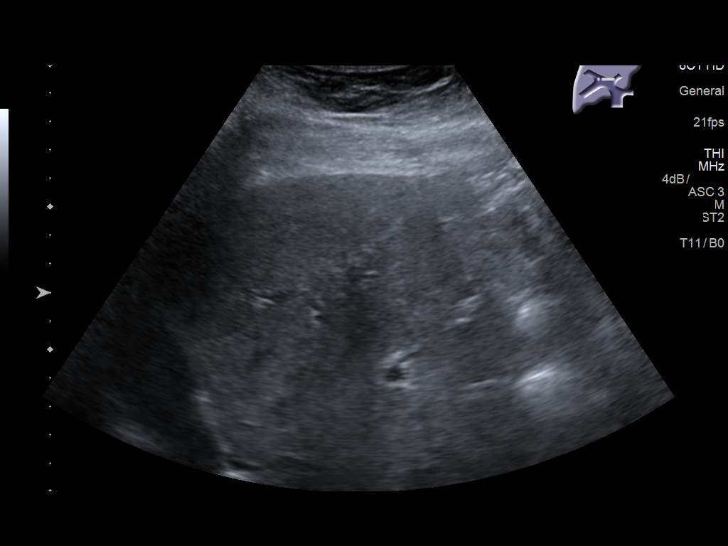
[im 21/26]
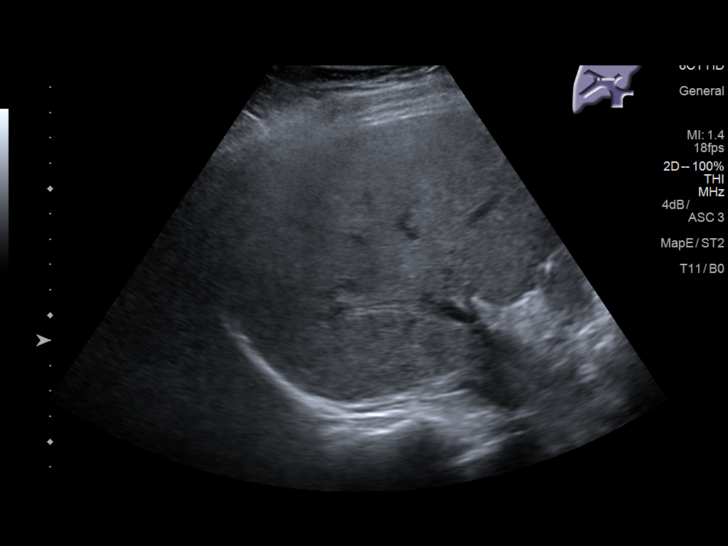
[im 23/26]
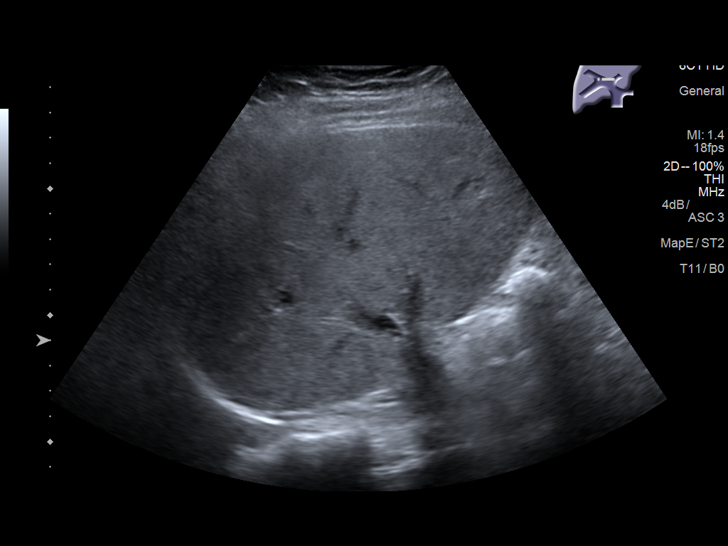
[im 26/26]
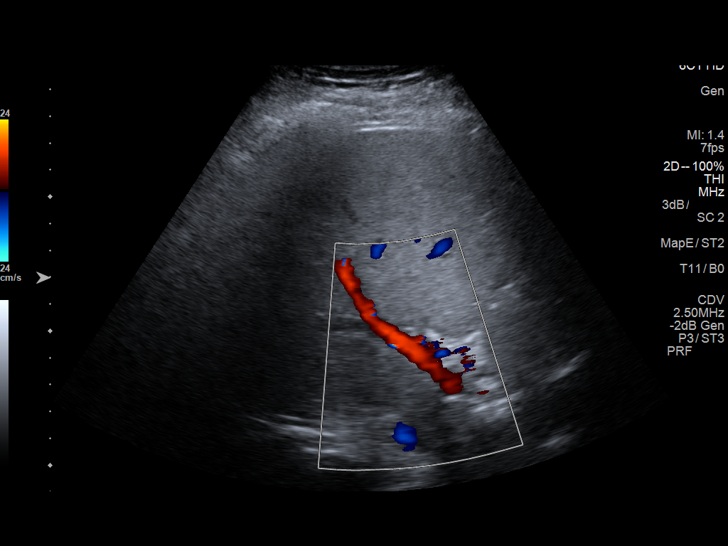

[14 of 25 positions shown; findings below may reference images not displayed]

FINDINGS: Gallbladder:

Surgically removed

Common bile duct:

Diameter: 5 mm

Liver:

Mildly increased in echogenicity without focal mass lesion. No
biliary ductal dilatation is seen. Portal vein is patent on color
Doppler imaging with normal direction of blood flow towards the
liver.
IMPRESSION: Status post cholecystectomy.  No ductal dilatation is noted.

Mild increased echogenicity is noted within the liver without focal
mass. This likely represents fatty infiltration.

## 2017-07-01 ENCOUNTER — Encounter (HOSPITAL_COMMUNITY): Payer: Self-pay | Admitting: Emergency Medicine

## 2017-07-01 ENCOUNTER — Inpatient Hospital Stay (HOSPITAL_COMMUNITY)
Admission: AD | Admit: 2017-07-01 | Discharge: 2017-07-02 | Disposition: A | Discharge status: Home / Self Care | Payer: 59 | Source: Ambulatory Visit | Attending: Obstetrics & Gynecology | Admitting: Obstetrics & Gynecology

## 2017-07-01 DIAGNOSIS — Z79899 Other long term (current) drug therapy: Secondary | ICD-10-CM | POA: Insufficient documentation

## 2017-07-01 DIAGNOSIS — R102 Pelvic and perineal pain: Secondary | ICD-10-CM | POA: Diagnosis not present

## 2017-07-01 DIAGNOSIS — I252 Old myocardial infarction: Secondary | ICD-10-CM | POA: Diagnosis not present

## 2017-07-01 DIAGNOSIS — Z9049 Acquired absence of other specified parts of digestive tract: Secondary | ICD-10-CM | POA: Insufficient documentation

## 2017-07-01 DIAGNOSIS — I10 Essential (primary) hypertension: Secondary | ICD-10-CM | POA: Diagnosis not present

## 2017-07-01 DIAGNOSIS — R1032 Left lower quadrant pain: Secondary | ICD-10-CM

## 2017-07-01 DIAGNOSIS — Z886 Allergy status to analgesic agent status: Secondary | ICD-10-CM | POA: Insufficient documentation

## 2017-07-01 DIAGNOSIS — Z7982 Long term (current) use of aspirin: Secondary | ICD-10-CM | POA: Insufficient documentation

## 2017-07-01 DIAGNOSIS — Z780 Asymptomatic menopausal state: Secondary | ICD-10-CM | POA: Insufficient documentation

## 2017-07-01 DIAGNOSIS — G932 Benign intracranial hypertension: Secondary | ICD-10-CM | POA: Insufficient documentation

## 2017-07-01 HISTORY — DX: Essential (primary) hypertension: I10

## 2017-07-01 LAB — URINALYSIS, ROUTINE W REFLEX MICROSCOPIC
Bilirubin Urine: NEGATIVE
GLUCOSE, UA: NEGATIVE mg/dL
Hgb urine dipstick: NEGATIVE
KETONES UR: NEGATIVE mg/dL
Leukocytes, UA: NEGATIVE
NITRITE: NEGATIVE
Protein, ur: NEGATIVE mg/dL
Specific Gravity, Urine: 1.028 (ref 1.005–1.030)
pH: 6 (ref 5.0–8.0)

## 2017-07-01 NOTE — MAU Note (Signed)
Pt C/O L ovarian pain since yesterday, became much more severe today.  Denies fever, vomiting or diarrhea.  Has had partial hysterectomy.

## 2017-07-02 DIAGNOSIS — R102 Pelvic and perineal pain: Secondary | ICD-10-CM

## 2017-07-02 DIAGNOSIS — R1032 Left lower quadrant pain: Secondary | ICD-10-CM | POA: Diagnosis not present

## 2017-07-02 LAB — WET PREP, GENITAL
CLUE CELLS WET PREP: NONE SEEN
Sperm: NONE SEEN
TRICH WET PREP: NONE SEEN
Yeast Wet Prep HPF POC: NONE SEEN

## 2017-07-02 LAB — GC/CHLAMYDIA PROBE AMP (~~LOC~~) NOT AT ARMC
CHLAMYDIA, DNA PROBE: NEGATIVE
NEISSERIA GONORRHEA: NEGATIVE

## 2017-07-02 LAB — CBC
HEMATOCRIT: 40 % (ref 36.0–46.0)
HEMOGLOBIN: 13.5 g/dL (ref 12.0–15.0)
MCH: 31.4 pg (ref 26.0–34.0)
MCHC: 33.8 g/dL (ref 30.0–36.0)
MCV: 93 fL (ref 78.0–100.0)
Platelets: 294 10*3/uL (ref 150–400)
RBC: 4.3 MIL/uL (ref 3.87–5.11)
RDW: 13.8 % (ref 11.5–15.5)
WBC: 5.3 10*3/uL (ref 4.0–10.5)

## 2017-07-02 LAB — POCT PREGNANCY, URINE: PREG TEST UR: NEGATIVE

## 2017-07-02 MED ORDER — IBUPROFEN 800 MG PO TABS: 800.0000 mg | ORAL_TABLET | Freq: Three times a day (TID) | ORAL | 0 refills | Status: DC | PRN

## 2017-07-02 MED ORDER — KETOROLAC TROMETHAMINE 30 MG/ML IJ SOLN
60.0000 mg | Freq: Once | INTRAMUSCULAR | Status: AC
  Administered 2017-07-02: 60 mg via INTRAMUSCULAR
  Filled 2017-07-02: qty 2

## 2017-07-02 NOTE — MAU Provider Note (Signed)
History     CSN: [id number]  Arrival date and time: 07/01/17 [id number]   First Provider Initiated Contact with Patient 07/01/17 2340      Chief Complaint  Patient presents with  . Abdominal Pain   56 y.o. Postmenopausal female here with LLQ pain. Pain started yesterday but has worsened since. Describes as sharp and intermittent, mostly occurring with movement and ambulation. Rates 10/10. Has not used anything for it. No urinary sx. No fevers. Is sexually active, no new partner. Feels certain pain is coming from ovary.   Past Medical History:  Diagnosis Date  . Hypertension   . Myocardial infarction (HCC) 01/2017  . Pseudotumor cerebri   . 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 ABDOMINAL HYSTERECTOMY    . BRAIN SURGERY    . TUBAL LIGATION      Family History  Problem Relation Age of Onset  . Hypertension Mother   . Diabetes Mother   . Cancer Father   . Hyperlipidemia Brother     Social History   Tobacco Use  . Smoking status: Never Smoker  . Smokeless tobacco: Never Used  Substance Use Topics  . Alcohol use: No  . Drug use: No    Allergies:  Allergies  Allergen Reactions  . Cephalosporins Shortness Of Breath  . Magnesium-Containing Compounds Shortness Of Breath    Rapid heart rate   . Strawberry Extract Anaphylaxis and Other (See Comments)  . Verapamil Shortness Of Breath  . Aleve [Naproxen] Other (See Comments)    Heart race   . Codeine Other (See Comments)    Headache     Medications Prior to Admission  Medication Sig Dispense Refill Last Dose  . acetaZOLAMIDE (DIAMOX) 500 MG capsule Take 1,500 mg by mouth 2 (two) times daily.   07/01/2017 at Unknown time  . acetaZOLAMIDE (DIAMOX) 500 MG capsule Take 3 capsules (1,500 mg total) by mouth 2 (two) times daily. 180 capsule 0 07/01/2017 at Unknown time  . aspirin 325 MG tablet Take 325 mg by mouth daily.   07/01/2017 at Unknown time  .  ergocalciferol (VITAMIN D2) 50000 units capsule Take 50,000 Units by mouth once a week.   Past Week at Unknown time  . hydrochlorothiazide (HYDRODIURIL) 25 MG tablet Take 25 mg by mouth daily.   07/01/2017 at Unknown time  . linaclotide (LINZESS) 290 MCG CAPS capsule Take 290 mcg by mouth daily before breakfast.   07/01/2017 at Unknown time  . venlafaxine (EFFEXOR) 75 MG tablet Take 75 mg by mouth 2 (two) times daily.   07/01/2017 at Unknown time  . candesartan (ATACAND) 16 MG tablet Take 16 mg by mouth daily.   Unknown at Unknown time  . clopidogrel (PLAVIX) 75 MG tablet Take 75 mg by mouth daily with breakfast.   More than a month at Unknown time  . levothyroxine (SYNTHROID, LEVOTHROID) 88 MCG tablet Take 88 mcg by mouth daily before breakfast.   More than a month at Unknown time  . ondansetron (ZOFRAN ODT) 4 MG disintegrating tablet Take 1 tablet (4 mg total) by mouth every 8 (eight) hours as needed for nausea. 10 tablet 0 More than a month at Unknown time  . traMADol (ULTRAM) 50 MG tablet Take 1 tablet (50 mg total) by mouth every 6 (six) hours as needed. 15 tablet 0 More than a month at Unknown time    Review of Systems  Constitutional: Negative for chills and fever.  Gastrointestinal: Negative for constipation, diarrhea, nausea and vomiting.  Genitourinary: Positive for pelvic pain (LLQ). Negative for dysuria, frequency, urgency, vaginal bleeding and vaginal discharge.   Physical Exam   Blood pressure (!) 142/90, pulse 87, temperature 97.7 F (36.5 C), temperature source Oral, resp. rate 18, height 5\' 8"  (1.727 m), weight 245 lb (111.1 kg).  Physical Exam  Nursing note and vitals reviewed. Constitutional: She is oriented to person, place, and time. She appears well-developed and well-nourished. No distress (appears comfortable).  HENT:  Head: Normocephalic and atraumatic.  Neck: Normal range of motion.  Cardiovascular: Normal rate.  Respiratory: Effort normal. No respiratory  distress.  GI: Soft. She exhibits no distension and no mass. There is no tenderness. There is no rebound and no guarding.  Genitourinary:  Genitourinary Comments: External: no lesions or erythema Vagina: rugated, pink, moist, scant white discharge, cervix absent Uterus: absent Adnexae: non masses, no tenderness left, no tenderness right   Musculoskeletal: Normal range of motion.  Neurological: She is alert and oriented to person, place, and time.  Skin: Skin is warm and dry.  Psychiatric: She has a normal mood and affect.   Results for orders placed or performed during the hospital encounter of 07/01/17 (from the past 24 hour(s))  Urinalysis, Routine w reflex microscopic     Status: None   Collection Time: 07/01/17  6:54 PM  Result Value Ref Range   Color, Urine YELLOW YELLOW   APPearance CLEAR CLEAR   Specific Gravity, Urine 1.028 1.005 - 1.030   pH 6.0 5.0 - 8.0   Glucose, UA NEGATIVE NEGATIVE mg/dL   Hgb urine dipstick NEGATIVE NEGATIVE   Bilirubin Urine NEGATIVE NEGATIVE   Ketones, ur NEGATIVE NEGATIVE mg/dL   Protein, ur NEGATIVE NEGATIVE mg/dL   Nitrite NEGATIVE NEGATIVE   Leukocytes, UA NEGATIVE NEGATIVE  Pregnancy, urine POC     Status: None   Collection Time: 07/02/17 12:05 AM  Result Value Ref Range   Preg Test, Ur NEGATIVE NEGATIVE  Wet prep, genital     Status: Abnormal   Collection Time: 07/02/17 12:15 AM  Result Value Ref Range   Yeast Wet Prep HPF POC NONE SEEN NONE SEEN   Trich, Wet Prep NONE SEEN NONE SEEN   Clue Cells Wet Prep HPF POC NONE SEEN NONE SEEN   WBC, Wet Prep HPF POC FEW (A) NONE SEEN   Sperm NONE SEEN   CBC     Status: None   Collection Time: 07/02/17 12:45 AM  Result Value Ref Range   WBC 5.3 4.0 - 10.5 K/uL   RBC 4.30 3.87 - 5.11 MIL/uL   Hemoglobin 13.5 12.0 - 15.0 g/dL   HCT 16.140.0 09.636.0 - 04.546.0 %   MCV 93.0 78.0 - 100.0 fL   MCH 31.4 26.0 - 34.0 pg   MCHC 33.8 30.0 - 36.0 g/dL   RDW 40.913.8 81.111.5 - 91.415.5 %   Platelets 294 150 - 400 K/uL    MAU Course  Procedures Toradol  MDM Labs ordered and reviewed. No evidence of acute abdominal or pelvic process. Will plan for GYN consult and outpt pelvic US. Stable for discharge home.   Assessment and Plan   1. LLQ pain   2. Pelvic pain    Discharge home Pelvic US in 1 week then follow up in [facility] Rx Ibuprofen  Allergies as of 07/02/2017      Reactions   Cephalosporins Shortness Of Breath   Magnesium-containing Compounds Shortness  Of Breath   Rapid heart rate    Strawberry Extract Anaphylaxis, Other (See Comments)   Verapamil Shortness Of Breath   Aleve [naproxen] Other (See Comments)   Heart race    Codeine Other (See Comments)   Headache       Medication List    TAKE these medications   acetaZOLAMIDE 500 MG capsule Commonly known as:  DIAMOX Take 1,500 mg by mouth 2 (two) times daily.   acetaZOLAMIDE 500 MG capsule Commonly known as:  DIAMOX Take 3 capsules (1,500 mg total) by mouth 2 (two) times daily.   aspirin 325 MG tablet Take 325 mg by mouth daily.   ATACAND 16 MG tablet Generic drug:  candesartan Take 16 mg by mouth daily.   clopidogrel 75 MG tablet Commonly known as:  PLAVIX Take 75 mg by mouth daily with breakfast.   ergocalciferol 50000 units capsule Commonly known as:  VITAMIN D2 Take 50,000 Units by mouth once a week.   hydrochlorothiazide 25 MG tablet Commonly known as:  HYDRODIURIL Take 25 mg by mouth daily.   ibuprofen 800 MG tablet Commonly known as:  ADVIL,MOTRIN Take 1 tablet (800 mg total) by mouth every 8 (eight) hours as needed.   levothyroxine 88 MCG tablet Commonly known as:  SYNTHROID, LEVOTHROID Take 88 mcg by mouth daily before breakfast.   LINZESS 290 MCG Caps capsule Generic drug:  linaclotide Take 290 mcg by mouth daily before breakfast.   ondansetron 4 MG disintegrating tablet Commonly known as:  ZOFRAN ODT Take 1 tablet (4 mg total) by mouth every 8 (eight) hours as needed for nausea.   traMADol 50 MG  tablet Commonly known as:  ULTRAM Take 1 tablet (50 mg total) by mouth every 6 (six) hours as needed.   venlafaxine 75 MG tablet Commonly known as:  EFFEXOR Take 75 mg by mouth 2 (two) times daily.       Donette Larry, CNM 07/02/2017, 1:14 AM

## 2017-07-02 NOTE — Discharge Instructions (Signed)
Pelvic Pain, Female °Pelvic pain is pain in your lower belly (abdomen), below your belly button and between your hips. The pain may start suddenly (acute), keep coming back (recurring), or last a long time (chronic). Pelvic pain that lasts longer than six months is considered chronic. There are many causes of pelvic pain. Sometimes the cause of your pelvic pain is not known. °Follow these instructions at home: °· Take over-the-counter and prescription medicines only as told by your doctor. °· Rest as told by your doctor. °· Do not have sex it if hurts. °· Keep a journal of your pelvic pain. Write down: °? When the pain started. °? Where the pain is located. °? What seems to make the pain better or worse, such as food or your menstrual cycle. °? Any symptoms you have along with the pain. °· Keep all follow-up visits as told by your doctor. This is important. °Contact a doctor if: °· Medicine does not help your pain. °· Your pain comes back. °· You have new symptoms. °· You have unusual vaginal discharge or bleeding. °· You have a fever or chills. °· You are having a hard time pooping (constipation). °· You have blood in your pee (urine) or poop (stool). °· Your pee smells bad. °· You feel weak or lightheaded. °Get help right away if: °· You have sudden pain that is very bad. °· Your pain continues to get worse. °· You have very bad pain and also have any of the following symptoms: °? A fever. °? Feeling stick to your stomach (nausea). °? Throwing up (vomiting). °? Being very sweaty. °· You pass out (lose consciousness). °This information is not intended to replace advice given to you by your health care provider. Make sure you discuss any questions you have with your health care provider. °Document Released: 12/10/2007 Document Revised: 07/18/2015 Document Reviewed: 04/13/2015 °Elsevier Interactive Patient Education © 2018 [employer] ° °

## 2017-07-07 HISTORY — PX: BAND HEMORRHOIDECTOMY: SHX1213

## 2017-07-09 ENCOUNTER — Ambulatory Visit (HOSPITAL_COMMUNITY)
Admission: RE | Admit: 2017-07-09 | Discharge: 2017-07-09 | Disposition: A | Discharge status: Home / Self Care | Payer: Managed Care, Other (non HMO) | Source: Ambulatory Visit | Attending: Certified Nurse Midwife | Admitting: Certified Nurse Midwife

## 2017-07-09 ENCOUNTER — Other Ambulatory Visit (HOSPITAL_COMMUNITY): Payer: Self-pay | Admitting: Certified Nurse Midwife

## 2017-07-09 DIAGNOSIS — Z90710 Acquired absence of both cervix and uterus: Secondary | ICD-10-CM | POA: Diagnosis not present

## 2017-07-09 DIAGNOSIS — R1032 Left lower quadrant pain: Secondary | ICD-10-CM

## 2017-07-09 DIAGNOSIS — R102 Pelvic and perineal pain: Secondary | ICD-10-CM

## 2017-07-09 IMAGING — US US TRANSVAGINAL NON-OB
1 series · 15 of 23 positions shown · non-contrast
Comparison: CT abdomen pelvis [DATE]

CLINICAL DATA: Patient with left lower quadrant abdominal pain.

EXAM:
ULTRASOUND PELVIS TRANSVAGINAL
TECHNIQUE: Transvaginal ultrasound examination of the pelvis was performed
including evaluation of the uterus, ovaries, adnexal regions, and
pelvic cul-de-sac.

[Series 1: us transvaginal non-ob · 15 of 23 slices shown]
[im 1/23]
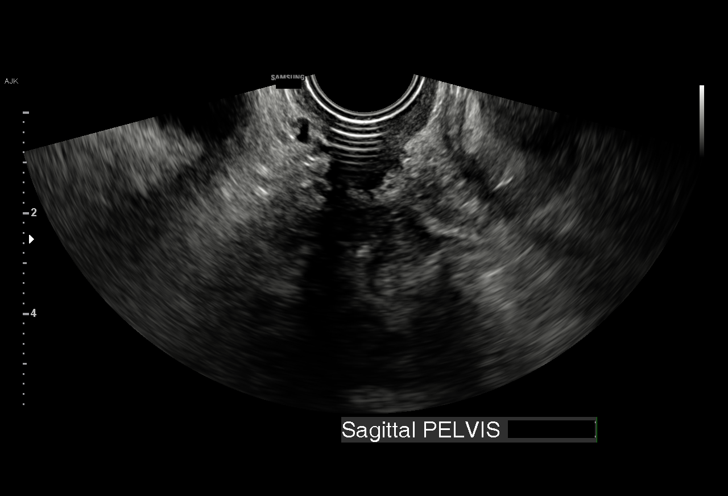
[im 3/23]
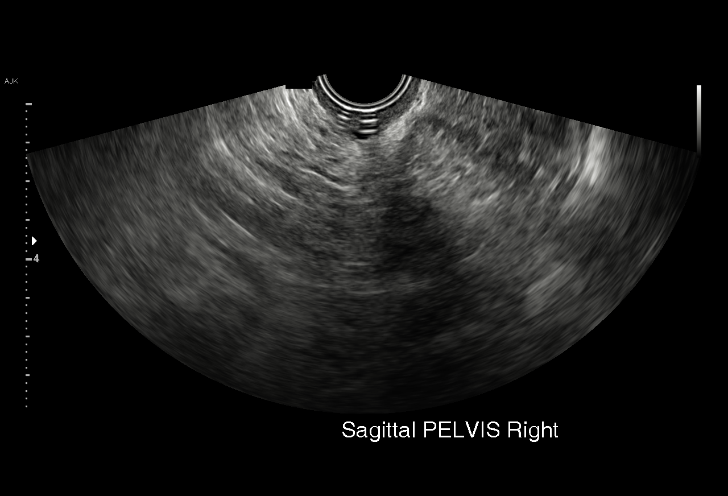
[im 4/23]
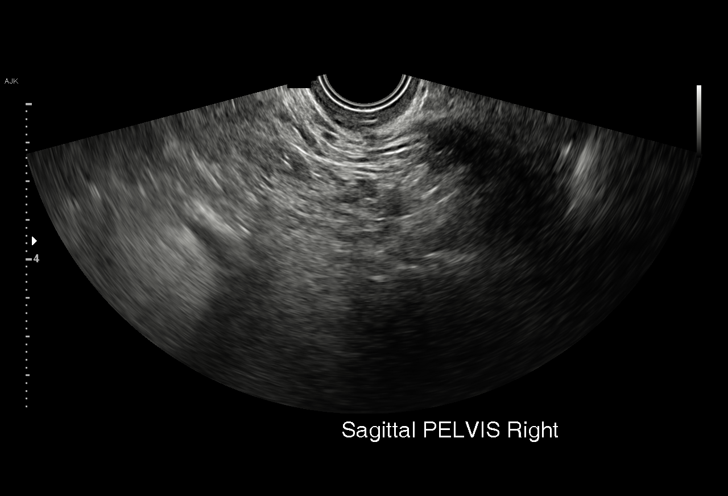
[im 6/23]
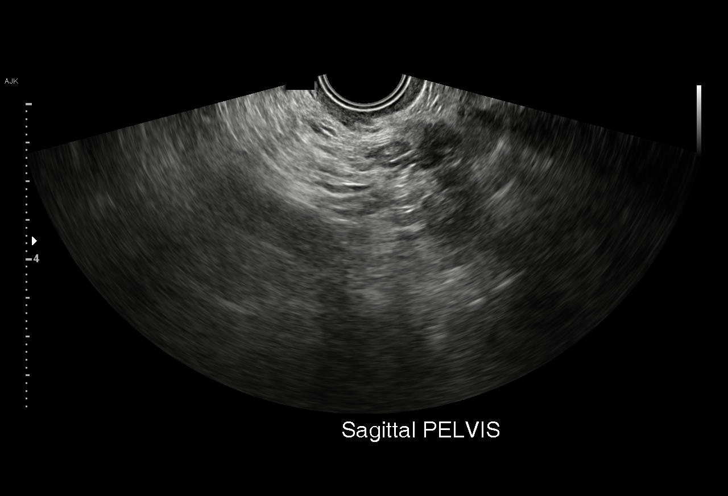
[im 7/23]
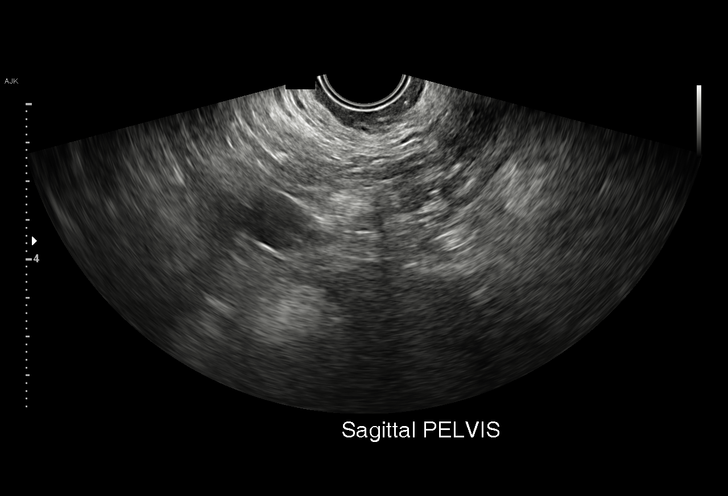
[im 9/23]
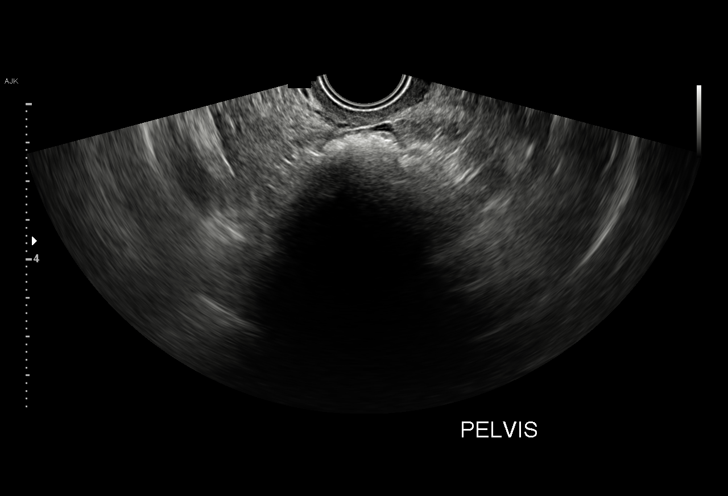
[im 10/23]
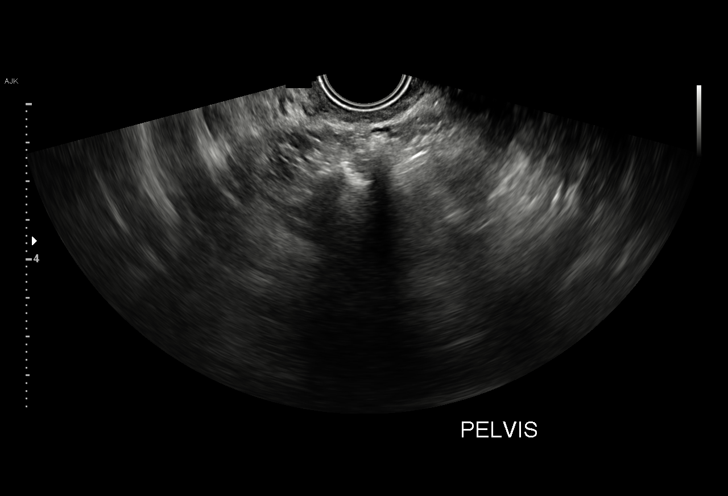
[im 12/23]
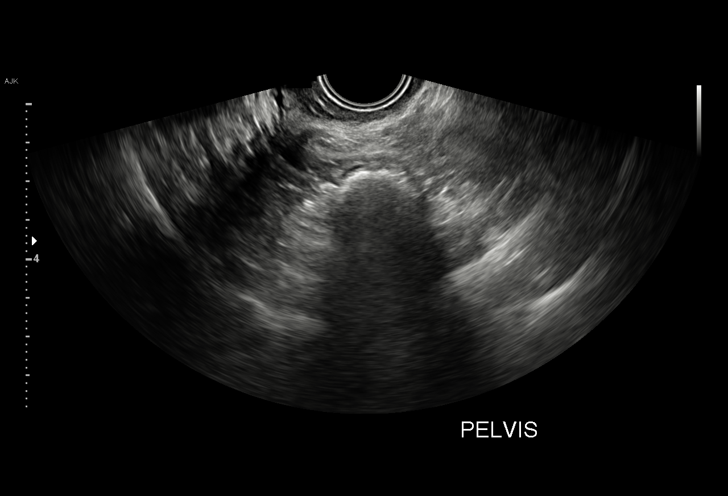
[im 14/23]
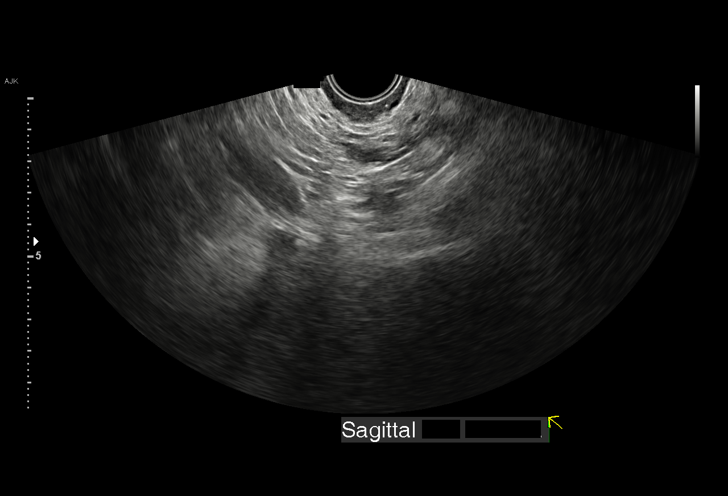
[im 15/23]
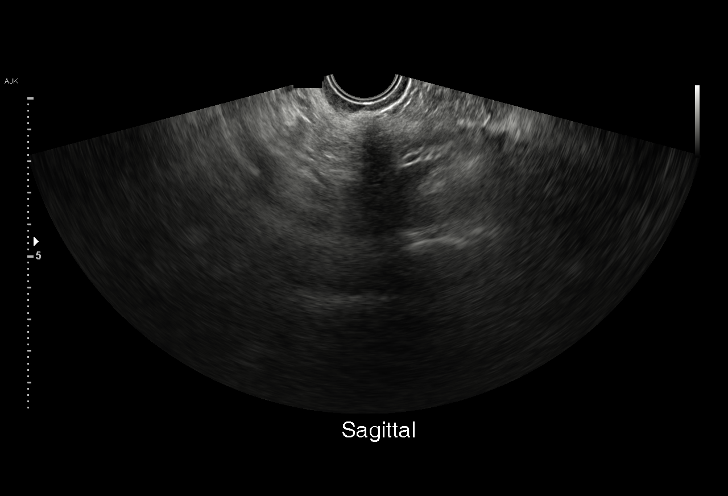
[im 17/23]
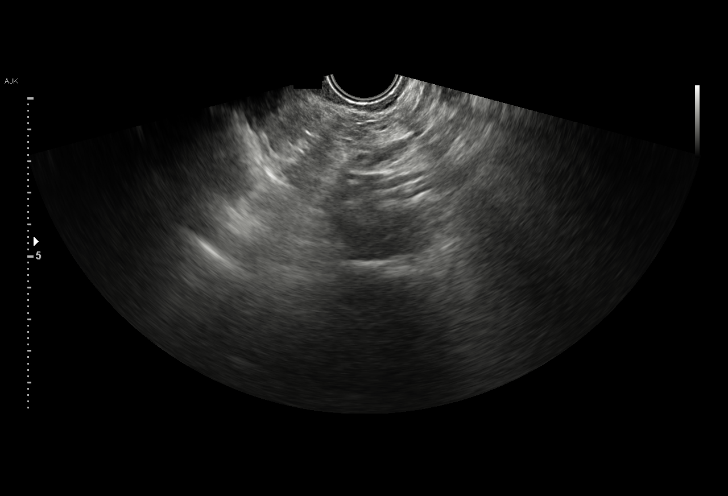
[im 18/23]
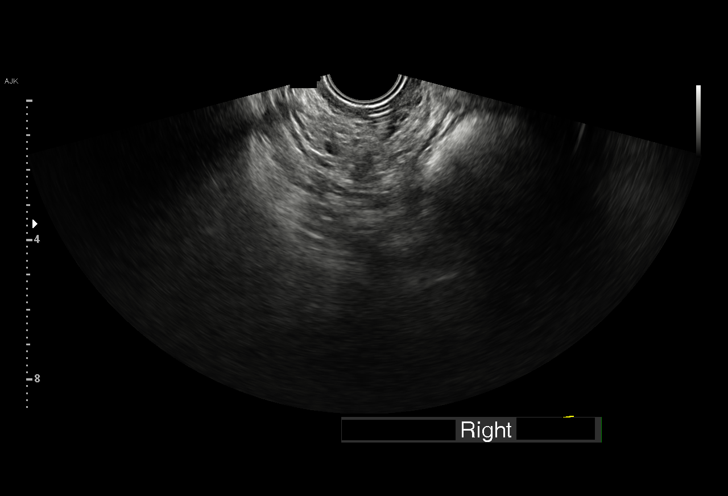
[im 20/23]
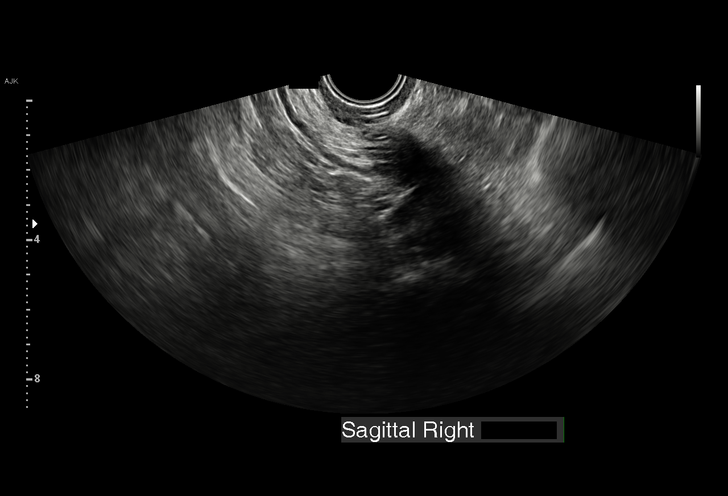
[im 21/23]
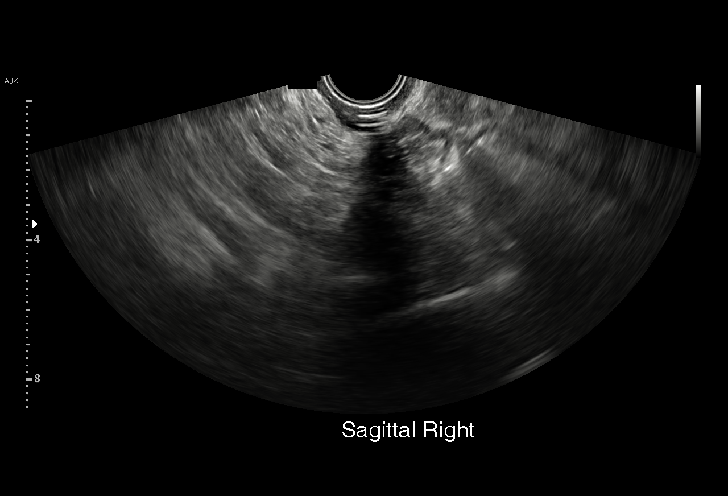
[im 23/23]
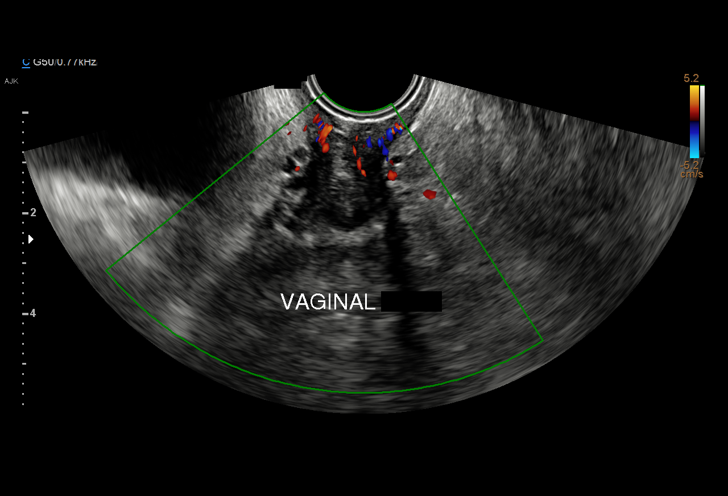

[15 of 23 positions shown; findings below may reference images not displayed]

FINDINGS: Uterus

Surgically absent.

Right ovary

Not visualized.

Left ovary

Not visualized.

Other findings:  No abnormal free fluid
IMPRESSION: Surgically absent uterus. The ovaries are not visualized. No pelvic
fluid collection or mass.

## 2017-07-20 ENCOUNTER — Encounter: Payer: Self-pay | Admitting: General Practice

## 2017-12-23 ENCOUNTER — Telehealth: Payer: Self-pay | Admitting: Internal Medicine

## 2017-12-23 NOTE — Telephone Encounter (Signed)
[facility] on 02/26/18 @ 9am with Dr. Tenny Crawoss. Sending records via inter-office mail to ATTN : Rose/Chart Prep. 12/23/17 ab

## 2018-02-26 ENCOUNTER — Ambulatory Visit: Payer: Managed Care, Other (non HMO) | Admitting: Internal Medicine

## 2018-02-26 ENCOUNTER — Encounter: Payer: Self-pay | Admitting: Internal Medicine

## 2018-02-26 VITALS — BP 116/78 | HR 78 | Ht 68.0 in | Wt 242.8 lb

## 2018-02-26 DIAGNOSIS — I059 Rheumatic mitral valve disease, unspecified: Secondary | ICD-10-CM

## 2018-02-26 DIAGNOSIS — I10 Essential (primary) hypertension: Secondary | ICD-10-CM

## 2018-02-26 DIAGNOSIS — R002 Palpitations: Secondary | ICD-10-CM

## 2018-02-26 DIAGNOSIS — R55 Syncope and collapse: Secondary | ICD-10-CM | POA: Diagnosis not present

## 2018-02-26 LAB — BASIC METABOLIC PANEL
BUN/Creatinine Ratio: 11 (ref 9–23)
BUN: 8 mg/dL (ref 6–24)
CALCIUM: 9.6 mg/dL (ref 8.7–10.2)
CO2: 22 mmol/L (ref 20–29)
Chloride: 98 mmol/L (ref 96–106)
Creatinine, Ser: 0.7 mg/dL (ref 0.57–1.00)
GFR calc Af Amer: 114 mL/min/{1.73_m2} (ref >59)
GFR calc non Af Amer: 99 mL/min/{1.73_m2} (ref >59)
Glucose: 90 mg/dL (ref 65–99)
POTASSIUM: 3.7 mmol/L (ref 3.5–5.2)
Sodium: 139 mmol/L (ref 134–144)

## 2018-02-26 LAB — CBC WITH DIFFERENTIAL/PLATELET
Basophils Absolute: 0 10*3/uL (ref 0.0–0.2)
Basos: 0 %
EOS (ABSOLUTE): 0 10*3/uL (ref 0.0–0.4)
Eos: 0 %
Hematocrit: 40.4 % (ref 34.0–46.6)
Hemoglobin: 13.8 g/dL (ref 11.1–15.9)
IMMATURE GRANULOCYTES: 0 %
Immature Grans (Abs): 0 10*3/uL (ref 0.0–0.1)
Lymphocytes Absolute: 1.8 10*3/uL (ref 0.7–3.1)
Lymphs: 33 %
MCH: 31.2 pg (ref 26.6–33.0)
MCHC: 34.2 g/dL (ref 31.5–35.7)
MCV: 91 fL (ref 79–97)
MONOS ABS: 0.2 10*3/uL (ref 0.1–0.9)
Monocytes: 4 %
NEUTROS PCT: 63 %
Neutrophils Absolute: 3.3 10*3/uL (ref 1.4–7.0)
PLATELETS: 402 10*3/uL (ref 150–450)
RBC: 4.43 x10E6/uL (ref 3.77–5.28)
RDW: 14.3 % (ref 12.3–15.4)
WBC: 5.4 10*3/uL (ref 3.4–10.8)

## 2018-02-26 LAB — TSH: TSH: 0.688 u[IU]/mL (ref 0.450–4.500)

## 2018-02-26 LAB — MAGNESIUM: Magnesium: 2.3 mg/dL (ref 1.6–2.3)

## 2018-02-26 MED ORDER — ASPIRIN EC 81 MG PO TBEC: 81.0000 mg | DELAYED_RELEASE_TABLET | Freq: Every day | ORAL | Status: AC

## 2018-02-26 NOTE — Patient Instructions (Addendum)
Medication Instructions:  Your physician has recommended you make the following change in your medication: DECREASE your Aspirin dose to 81 mg once daily    Labwork: TODAY - Magnesium, Basic metabolic panel, TSH, CBC w/diff    Testing/Procedures: Your physician has recommended that you wear an event monitor. Event monitors are medical devices that record the heart's electrical activity. Doctors most often us these monitors to diagnose arrhythmias. Arrhythmias are problems with the speed or rhythm of the heartbeat. The monitor is a small, portable device. You can wear one while you do your normal daily activities. This is usually used to diagnose what is causing palpitations/syncope (passing out).   Follow-Up: Your physician recommends that you schedule a follow-up appointment in: will determine based on monitor results   If you need a refill on your cardiac medications before your next appointment, please call your pharmacy.   Thank you for choosing [facility]! Eligha BridegroomMichelle Maximilliano Kersh, RN [phone]

## 2018-02-26 NOTE — Progress Notes (Addendum)
Cardiology Office Note   Date:  02/26/2018   ID:  Theresa Case, DOB [birth year], MRN [id number]  PCP:  Darryl Lent, PA-C  Cardiologist:   Dietrich Pates, MD   Pt referred by Therisa Doyne for evaluation of palpitatoins     History of Present Illness: Theresa Case is a 57 y.o. female with a history of palpitations   Can be sitting still    Heart races Coughs   ONe day passed out   Seen at [facility]   Wore monitor for 7 days   No events noted   Pt says at Age 57 had problems with heart   Mitral valve prolapse She says it got better   Pt has seen another cardiologist in [place]  Had bigeminy / trigeminay   Placed on Cardiazem 11 years ago   Got better   Took off     Moved to [place] 6 years ago    Last fall 2018 while at home her L  Arm went numb   Fingers tingled  This lasted for a couple days   Has not had since  Seen at [facility]    Pts says one doctor told her she had a heart attack in past based on EKG   Echo and stress test done      Pt says that her heartart racing occurs intermittently   Does not last long   Lasts a few mins  No syncope    Current Meds  Medication Sig  . aspirin 325 MG tablet Take 325 mg by mouth daily.  . ergocalciferol (VITAMIN D2) 50000 units capsule Take 50,000 Units by mouth once a week.  . hydrochlorothiazide (HYDRODIURIL) 25 MG tablet Take 25 mg by mouth daily.  Marland Kitchen linaclotide (LINZESS) 290 MCG CAPS capsule Take 290 mcg by mouth daily before breakfast.  . venlafaxine (EFFEXOR) 75 MG tablet Take 75 mg by mouth 2 (two) times daily.     Allergies:   Cephalexin; Cephalosporins; Magnesium; Magnesium carbonate; Magnesium trisilicate; Magnesium-containing compounds; Strawberry extract; Verapamil; Aleve [naproxen]; Codeine; Naproxen sodium; Tape; and Tramadol   Past Medical History:  Diagnosis Date  . Hypertension   . Myocardial infarction (HCC) 01/2017  . Pseudotumor cerebri   . 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 ABDOMINAL HYSTERECTOMY    . BRAIN SURGERY    . TUBAL LIGATION       Social History:  The patient  reports that she has never smoked. She has never used smokeless tobacco. She reports that she does not drink alcohol or use drugs.   Family History:  The patient's family history includes Cancer in her father; Diabetes in her mother; Hyperlipidemia in her brother; Hypertension in her mother.    ROS:  Please see the history of present illness. All other systems are reviewed and  Negative to the above problem except as noted.    PHYSICAL EXAM: VS:  BP 116/78 (BP Location: Left Arm, Patient Position: Sitting, Cuff Size: Large)   Pulse 78   Ht 5\' 8"  (1.727 m)   Wt 242 lb 12.8 oz (110.1 kg)   BMI 36.92 kg/m   GEN: OBese 57 yo in no acute distress  HEENT: normal  Neck: no JVD, carotid bruits, or masses Cardiac: RRR; no murmurs, rubs, or gallops,no edema  Respiratory:  clear to auscultation bilaterally, normal work of breathing GI: soft, nontender, nondistended, + BS  No hepatomegaly  MS: no deformity Moving all extremities   Skin: warm and dry, no rash Neuro:  Strength and sensation are intact Psych: euthymic mood, full affect   EKG:  EKG is ordered today.SR 78 bpm   Lipid Panel No results found for: CHOL, TRIG, HDL, CHOLHDL, VLDL, LDLCALC, LDLDIRECT    Wt Readings from Last 3 Encounters:  02/26/18 242 lb 12.8 oz (110.1 kg)  07/01/17 245 lb (111.1 kg)      ASSESSMENT AND PLAN:  1  Palpitations   Need to get outside records    Would also set up for an event monitor   Will check lasbs today    Episodes are apparent to pt but not hemodynamically destabilizing    Pt has been on ASA   Wants to take   Would decresae to 81 mg    2  Hx MV dz  No murmur   Will review echo  3   HTN   BP is good   Follow       ADDENDUM::  03/26/18     Records from [facility] :   Echo 9 05/05/17)  Normal   LVEF normal   GXT  Myovue 05/14/17      Electrically negative for ischemia  Myovue with normal perfusion LVEF 67%\  Carotid USN 05/05/17   No significant plaquing  Signed, Dietrich PatesPaula Jaquita Bessire, MD  02/26/2018 9:25 AM    [facility] [address], [place], [place]  [address] Phone: [phone]; Fax: [phone]

## 2018-04-05 ENCOUNTER — Ambulatory Visit (INDEPENDENT_AMBULATORY_CARE_PROVIDER_SITE_OTHER): Payer: Managed Care, Other (non HMO)

## 2018-04-05 DIAGNOSIS — R002 Palpitations: Secondary | ICD-10-CM | POA: Diagnosis not present

## 2018-04-05 DIAGNOSIS — R55 Syncope and collapse: Secondary | ICD-10-CM | POA: Diagnosis not present

## 2018-05-31 ENCOUNTER — Encounter: Payer: Self-pay | Admitting: *Deleted

## 2018-05-31 ENCOUNTER — Other Ambulatory Visit: Payer: Self-pay | Admitting: *Deleted

## 2018-05-31 MED ORDER — METOPROLOL SUCCINATE ER 25 MG PO TB24: 12.5000 mg | ORAL_TABLET | Freq: Every day | ORAL | 3 refills | Status: DC

## 2018-05-31 NOTE — Progress Notes (Signed)
Notes recorded by Pricilla Riffleoss, Paula V, MD on 05/06/2018 at 12:50 AM EDT Monitor showed no arrhythmias Could try very low dose toprol 12. 5 mg to see if helps Stay hydrated  _____________________________________________ Reached patient today and informed.

## 2018-07-19 ENCOUNTER — Other Ambulatory Visit: Payer: Self-pay

## 2018-07-19 ENCOUNTER — Emergency Department (HOSPITAL_COMMUNITY): Payer: Managed Care, Other (non HMO)

## 2018-07-19 ENCOUNTER — Observation Stay (HOSPITAL_COMMUNITY)
Admission: EM | Admit: 2018-07-19 | Discharge: 2018-07-21 | Disposition: A | Discharge status: Home / Self Care | Payer: Managed Care, Other (non HMO) | Attending: Surgery | Admitting: Surgery

## 2018-07-19 ENCOUNTER — Encounter (HOSPITAL_COMMUNITY): Payer: Self-pay | Admitting: Emergency Medicine

## 2018-07-19 DIAGNOSIS — Z9049 Acquired absence of other specified parts of digestive tract: Secondary | ICD-10-CM | POA: Diagnosis not present

## 2018-07-19 DIAGNOSIS — Z885 Allergy status to narcotic agent status: Secondary | ICD-10-CM | POA: Diagnosis not present

## 2018-07-19 DIAGNOSIS — Z888 Allergy status to other drugs, medicaments and biological substances status: Secondary | ICD-10-CM | POA: Insufficient documentation

## 2018-07-19 DIAGNOSIS — Z87891 Personal history of nicotine dependence: Secondary | ICD-10-CM | POA: Diagnosis not present

## 2018-07-19 DIAGNOSIS — Z7982 Long term (current) use of aspirin: Secondary | ICD-10-CM | POA: Diagnosis not present

## 2018-07-19 DIAGNOSIS — K8012 Calculus of gallbladder with acute and chronic cholecystitis without obstruction: Principal | ICD-10-CM | POA: Insufficient documentation

## 2018-07-19 DIAGNOSIS — Z79899 Other long term (current) drug therapy: Secondary | ICD-10-CM | POA: Diagnosis not present

## 2018-07-19 DIAGNOSIS — Z881 Allergy status to other antibiotic agents status: Secondary | ICD-10-CM | POA: Diagnosis not present

## 2018-07-19 DIAGNOSIS — K8050 Calculus of bile duct without cholangitis or cholecystitis without obstruction: Secondary | ICD-10-CM | POA: Diagnosis present

## 2018-07-19 DIAGNOSIS — Z419 Encounter for procedure for purposes other than remedying health state, unspecified: Secondary | ICD-10-CM

## 2018-07-19 DIAGNOSIS — Z886 Allergy status to analgesic agent status: Secondary | ICD-10-CM | POA: Diagnosis not present

## 2018-07-19 DIAGNOSIS — K810 Acute cholecystitis: Secondary | ICD-10-CM

## 2018-07-19 DIAGNOSIS — I10 Essential (primary) hypertension: Secondary | ICD-10-CM | POA: Insufficient documentation

## 2018-07-19 DIAGNOSIS — R1013 Epigastric pain: Secondary | ICD-10-CM | POA: Diagnosis present

## 2018-07-19 HISTORY — DX: Personal history of urinary calculi: Z87.442

## 2018-07-19 HISTORY — DX: Cardiac murmur, unspecified: R01.1

## 2018-07-19 HISTORY — DX: Pneumonia, unspecified organism: J18.9

## 2018-07-19 HISTORY — DX: Unspecified osteoarthritis, unspecified site: M19.90

## 2018-07-19 HISTORY — DX: Headache, unspecified: R51.9

## 2018-07-19 HISTORY — DX: Other specified postprocedural states: R11.2

## 2018-07-19 HISTORY — DX: Other specified postprocedural states: Z98.890

## 2018-07-19 HISTORY — DX: Gastro-esophageal reflux disease without esophagitis: K21.9

## 2018-07-19 HISTORY — DX: Headache: R51

## 2018-07-19 LAB — CBC WITH DIFFERENTIAL/PLATELET
Abs Immature Granulocytes: 0.02 10*3/uL (ref 0.00–0.07)
Basophils Absolute: 0 10*3/uL (ref 0.0–0.1)
Basophils Relative: 0 %
Eosinophils Absolute: 0 10*3/uL (ref 0.0–0.5)
Eosinophils Relative: 0 %
HCT: 41.4 % (ref 36.0–46.0)
Hemoglobin: 13.8 g/dL (ref 12.0–15.0)
Immature Granulocytes: 0 %
Lymphocytes Relative: 26 %
Lymphs Abs: 1.9 10*3/uL (ref 0.7–4.0)
MCH: 30 pg (ref 26.0–34.0)
MCHC: 33.3 g/dL (ref 30.0–36.0)
MCV: 90 fL (ref 80.0–100.0)
MONO ABS: 0.5 10*3/uL (ref 0.1–1.0)
Monocytes Relative: 7 %
Neutro Abs: 4.8 10*3/uL (ref 1.7–7.7)
Neutrophils Relative %: 67 %
Platelets: 347 10*3/uL (ref 150–400)
RBC: 4.6 MIL/uL (ref 3.87–5.11)
RDW: 12.8 % (ref 11.5–15.5)
WBC: 7.1 10*3/uL (ref 4.0–10.5)
nRBC: 0 % (ref 0.0–0.2)

## 2018-07-19 LAB — I-STAT BETA HCG BLOOD, ED (MC, WL, AP ONLY): HCG, QUANTITATIVE: 5.7 m[IU]/mL — AB (ref <5)

## 2018-07-19 LAB — URINALYSIS, ROUTINE W REFLEX MICROSCOPIC
Bilirubin Urine: NEGATIVE
Glucose, UA: NEGATIVE mg/dL
Hgb urine dipstick: NEGATIVE
Ketones, ur: NEGATIVE mg/dL
Leukocytes, UA: NEGATIVE
Nitrite: NEGATIVE
Protein, ur: NEGATIVE mg/dL
Specific Gravity, Urine: 1.008 (ref 1.005–1.030)
pH: 7 (ref 5.0–8.0)

## 2018-07-19 LAB — COMPREHENSIVE METABOLIC PANEL
ALBUMIN: 4.3 g/dL (ref 3.5–5.0)
ALK PHOS: 83 U/L (ref 38–126)
ALT: 171 U/L — ABNORMAL HIGH (ref 0–44)
AST: 251 U/L — ABNORMAL HIGH (ref 15–41)
Anion gap: 11 (ref 5–15)
BUN: 9 mg/dL (ref 6–20)
CO2: 25 mmol/L (ref 22–32)
Calcium: 9.7 mg/dL (ref 8.9–10.3)
Chloride: 102 mmol/L (ref 98–111)
Creatinine, Ser: 0.69 mg/dL (ref 0.44–1.00)
GFR calc non Af Amer: >60 mL/min (ref >60)
GLUCOSE: 101 mg/dL — AB (ref 70–99)
POTASSIUM: 3.1 mmol/L — AB (ref 3.5–5.1)
SODIUM: 138 mmol/L (ref 135–145)
Total Bilirubin: 0.4 mg/dL (ref 0.3–1.2)
Total Protein: 7.8 g/dL (ref 6.5–8.1)

## 2018-07-19 LAB — I-STAT TROPONIN, ED: Troponin i, poc: 0 ng/mL (ref 0.00–0.08)

## 2018-07-19 LAB — LIPASE, BLOOD: Lipase: 23 U/L (ref 11–51)

## 2018-07-19 LAB — CBG MONITORING, ED: Glucose-Capillary: 94 mg/dL (ref 70–99)

## 2018-07-19 IMAGING — US US ABDOMEN COMPLETE
1 series · 14 of 25 positions shown · non-contrast
Comparison: CT [DATE]

CLINICAL DATA: Generalized upper abdominal pain beginning today.

EXAM:
ABDOMEN ULTRASOUND COMPLETE

[Series 1: us abdomen complete · 14 of 81 slices shown]
[im 1/81]
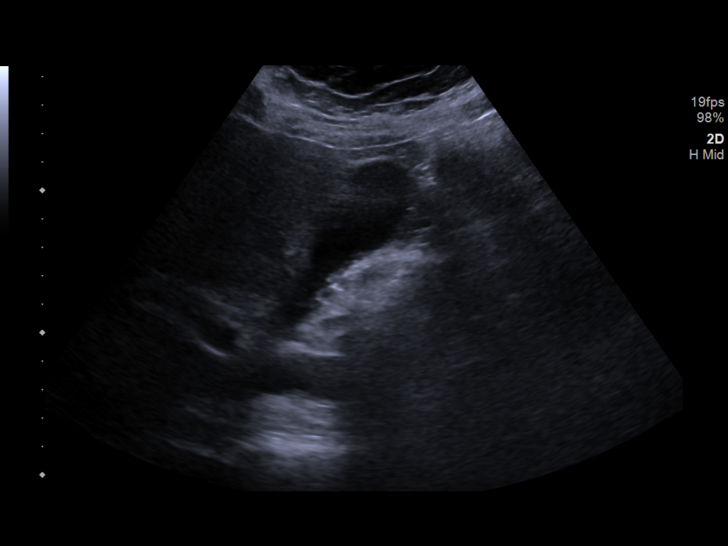
[im 7/81]
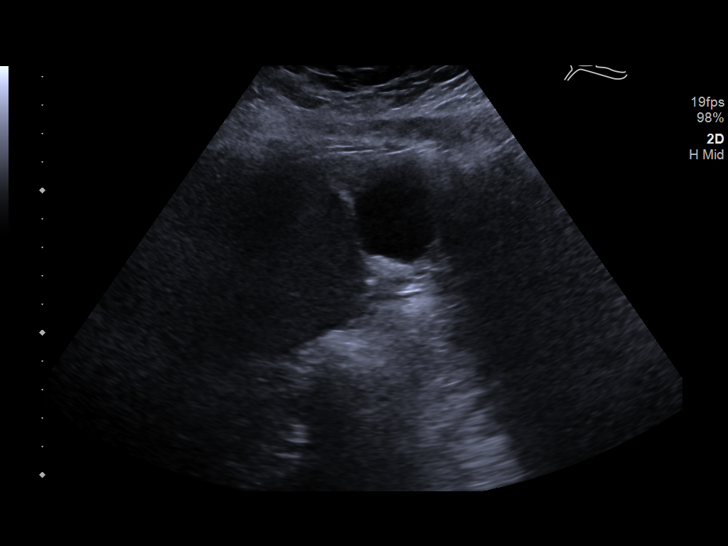
[im 14/81]
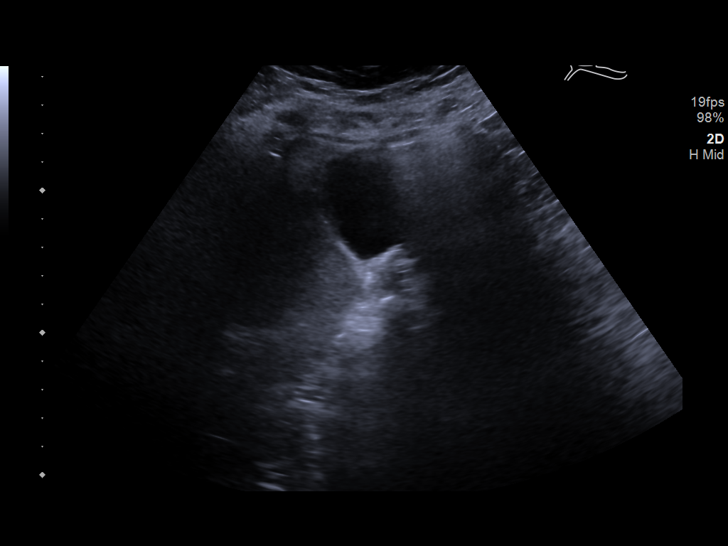
[im 21/81]
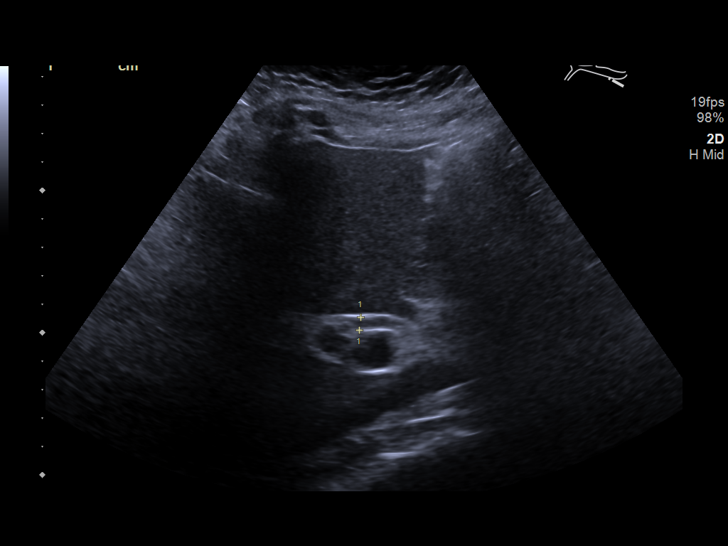
[im 27/81]
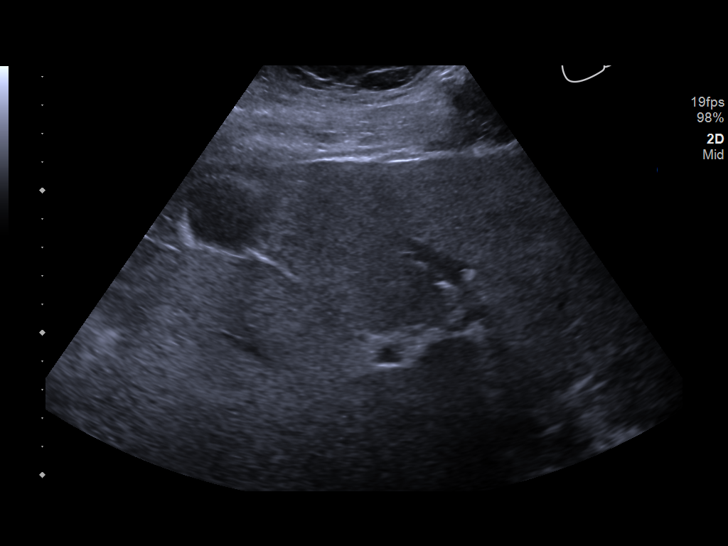
[im 31/81]
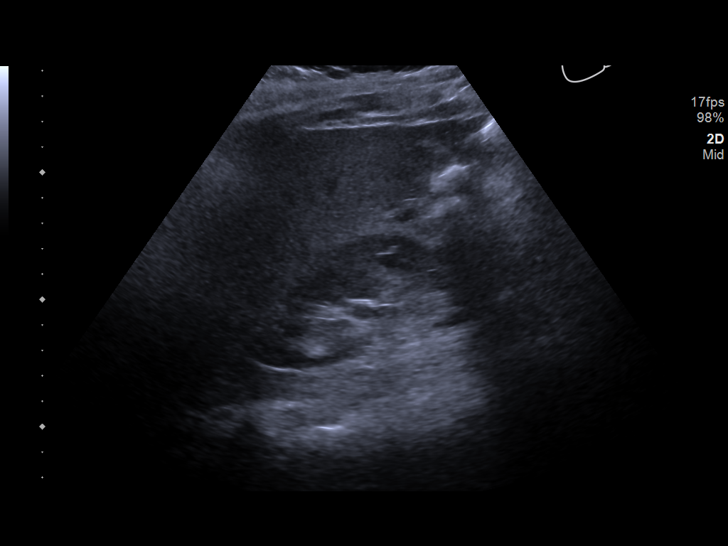
[im 37/81]
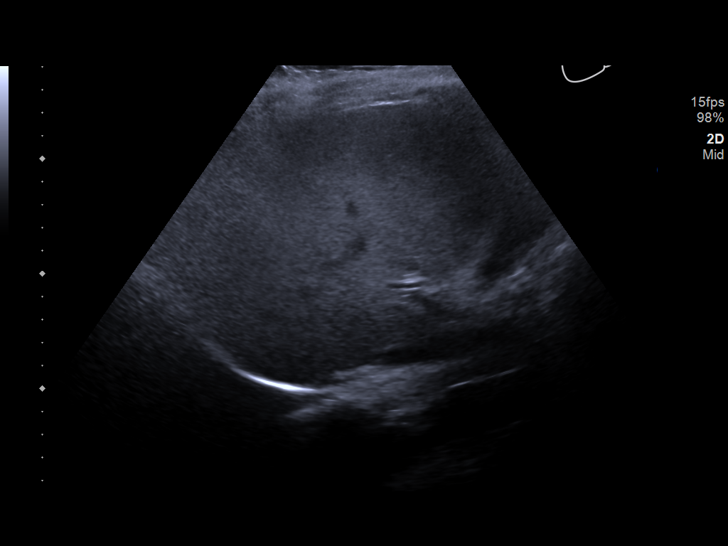
[im 44/81]
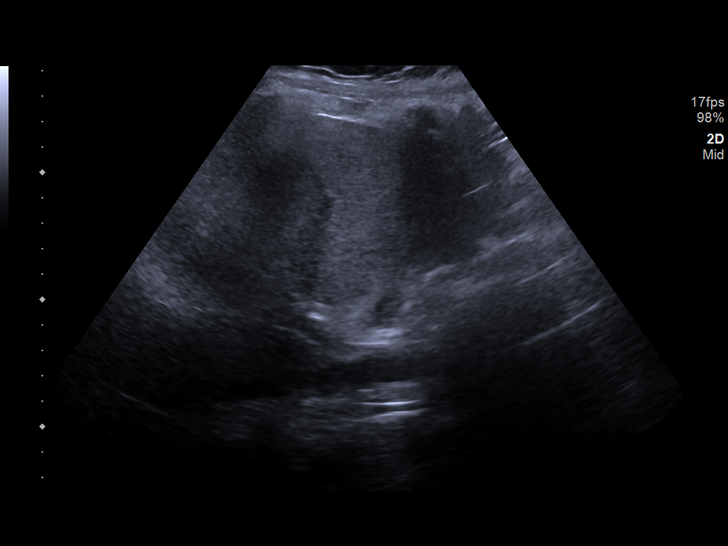
[im 51/81]
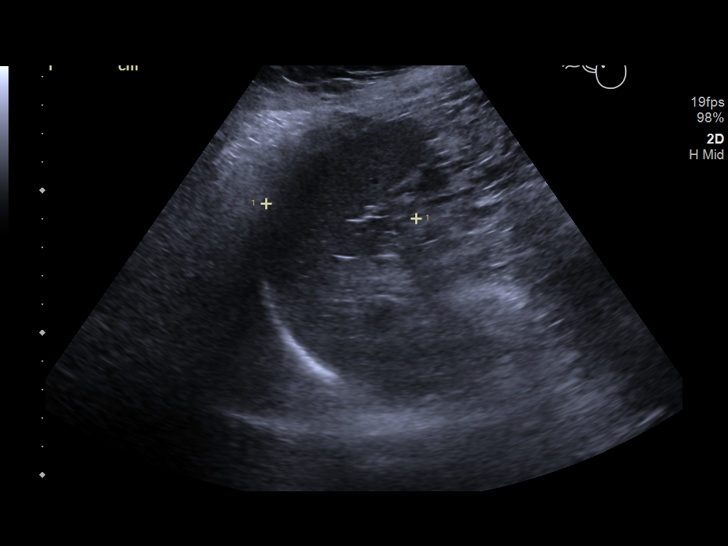
[im 54/81]
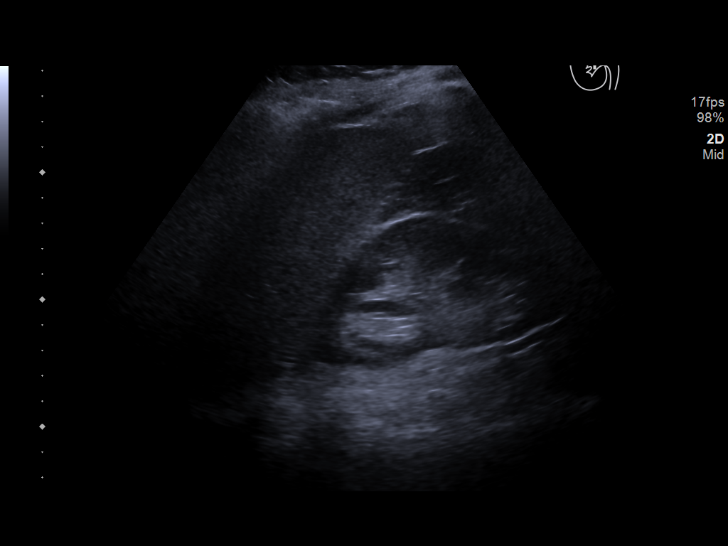
[im 61/81]
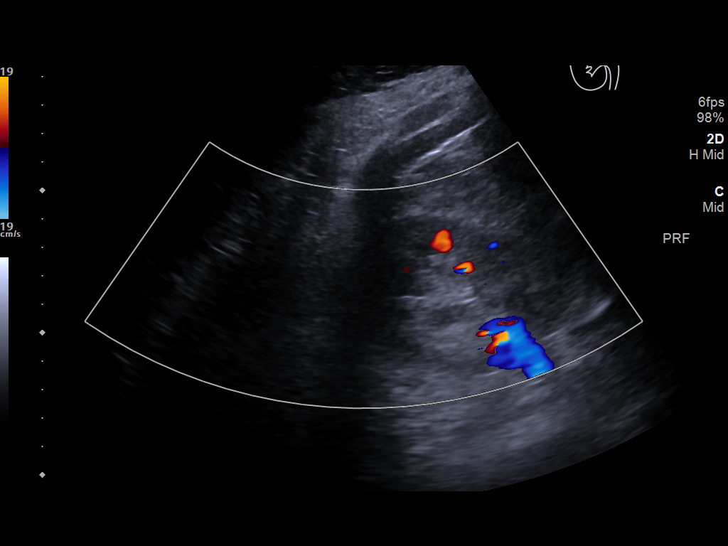
[im 67/81]
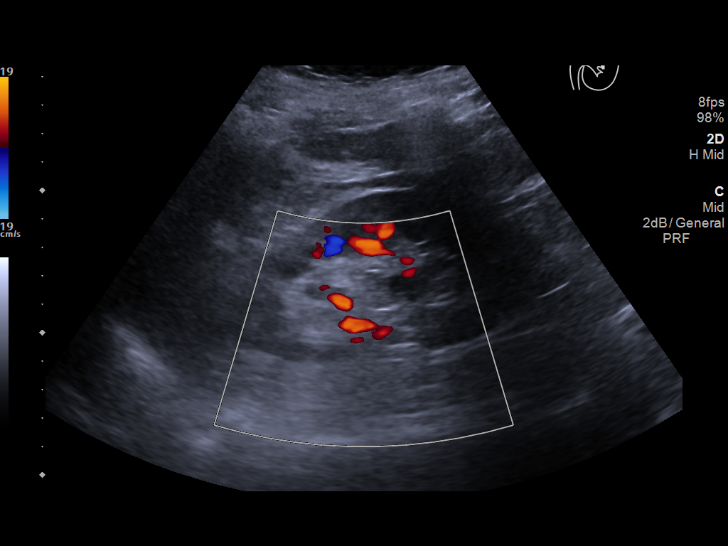
[im 74/81]
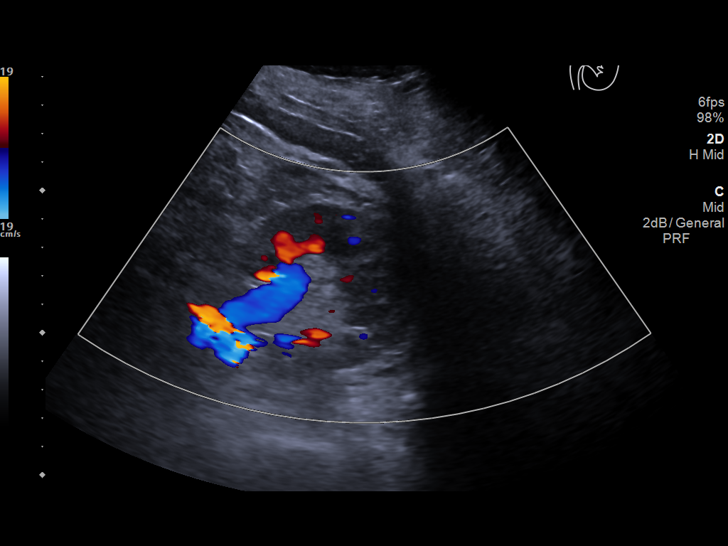
[im 81/81]
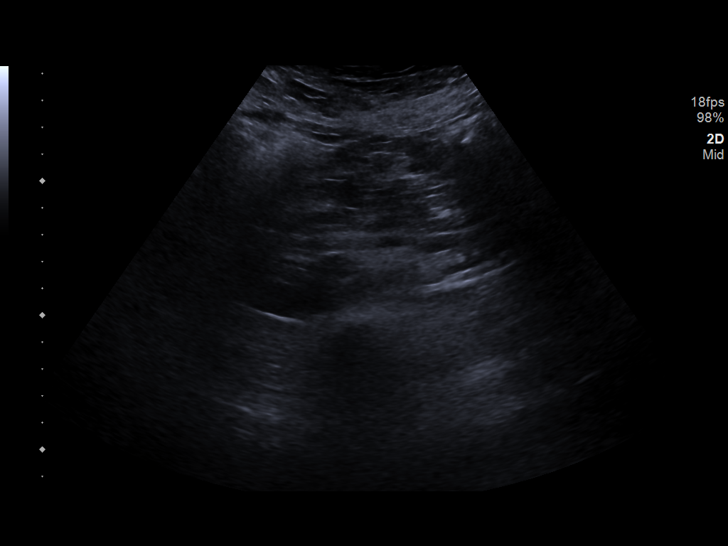

[14 of 25 positions shown; findings below may reference images not displayed]

FINDINGS: Gallbladder: Small stones dependent in the gallbladder. No wall
thickening. No surrounding fluid. No Murphy sign.

Common bile duct: Diameter: 4 mm.  Normal.

Liver: Echogenic liver suggesting fatty change. No focal lesion or
ductal dilatation. Portal vein is patent on color Doppler imaging
with normal direction of blood flow towards the liver.

IVC: No abnormality visualized.

Pancreas: Poorly seen because of overlying bowel gas.

Spleen: Size and appearance within normal limits.

Right Kidney: Length: 11.6 cm. Echogenicity within normal limits. No
mass or hydronephrosis visualized.

Left Kidney: Length: 11.8 cm. Echogenicity within normal limits. No
mass or hydronephrosis visualized.

Abdominal aorta: No aneurysm visualized.

Other findings: No ascites
IMPRESSION: Small stones dependent in the gallbladder, but no sonographic
evidence of cholecystitis or obstruction. Mild fatty liver.

## 2018-07-19 IMAGING — DX DG CHEST 1V PORT
1 series · 1 of 1 positions shown · non-contrast
Comparison: None.

CLINICAL DATA: Epigastric pain and severe dizziness

EXAM:
PORTABLE CHEST 1 VIEW

[chest ap]
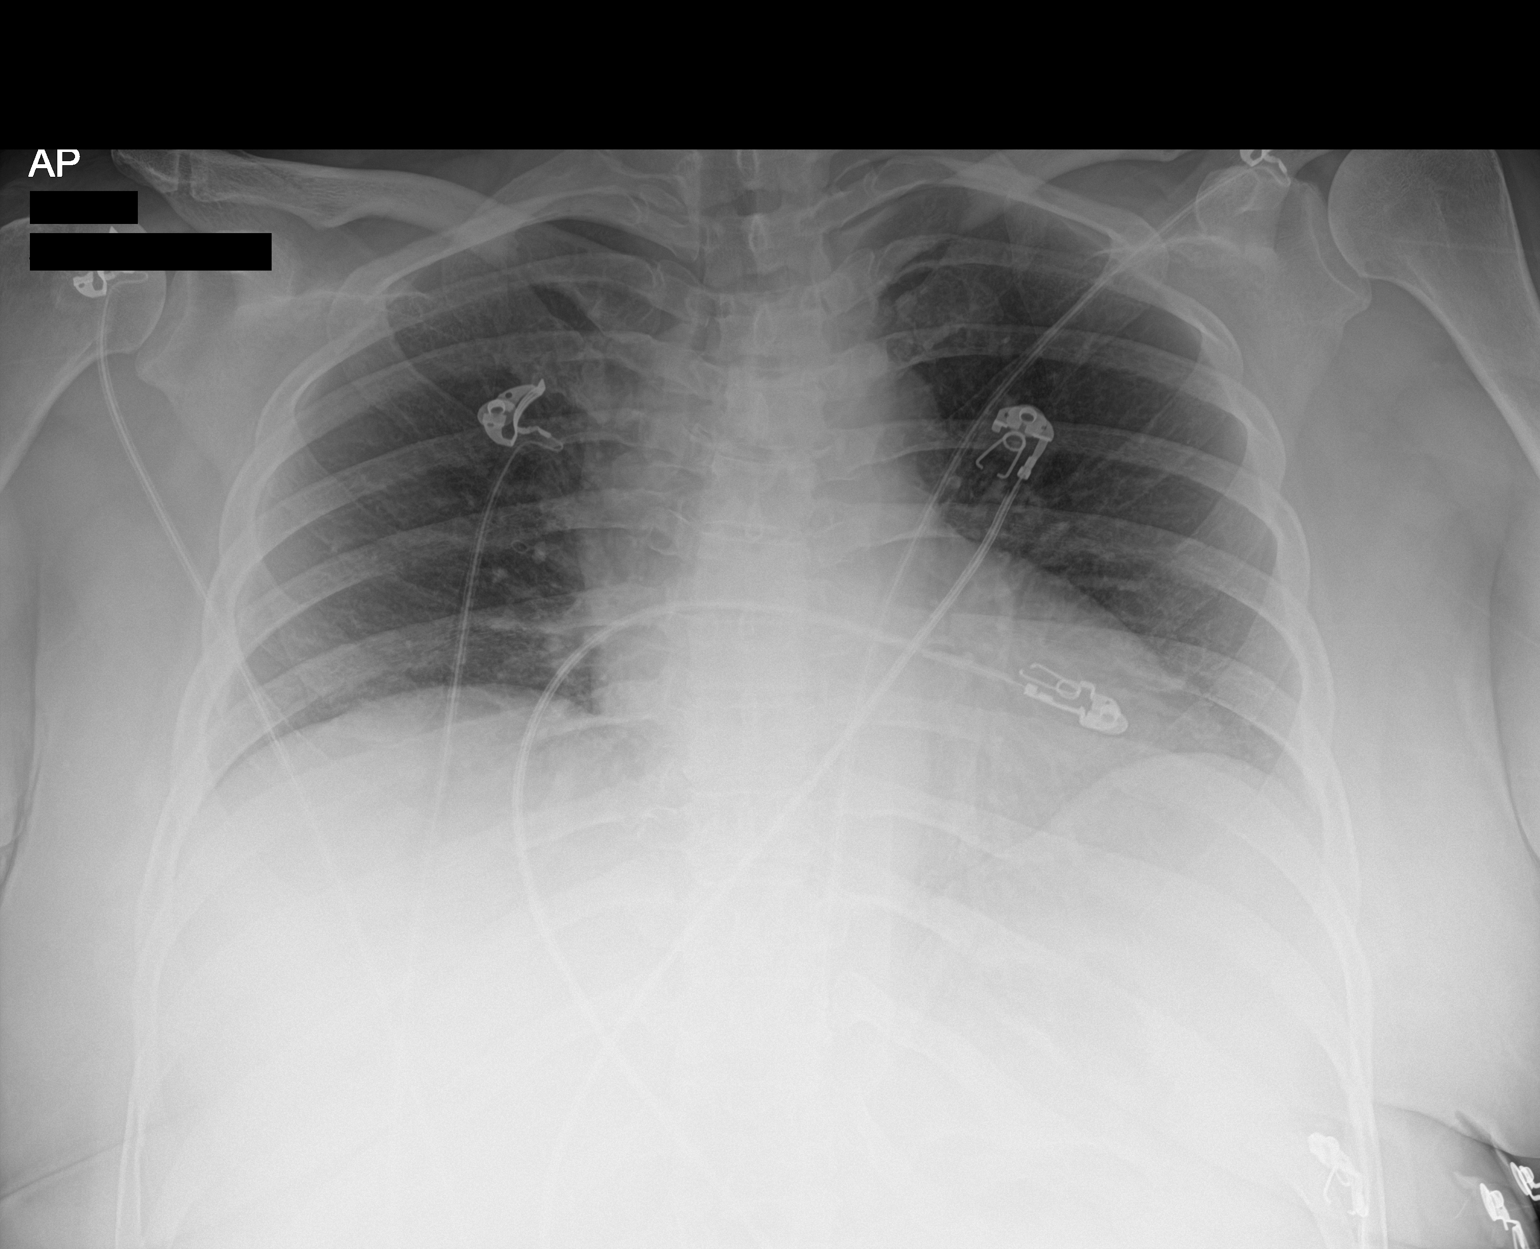

[1 of 1 positions shown; findings below may reference images not displayed]

FINDINGS: Normal heart size. Lungs are under aerated and grossly clear. Normal
vascularity. No pneumothorax or pleural effusion.
IMPRESSION: No active disease.

## 2018-07-19 IMAGING — DX DG ABD PORTABLE 1V
2 series · 2 of 2 positions shown · non-contrast
Comparison: CT [DATE]

CLINICAL DATA: Epigastric pain

EXAM:
PORTABLE ABDOMEN - 1 VIEW

[abdomen kub (1 of 2)]
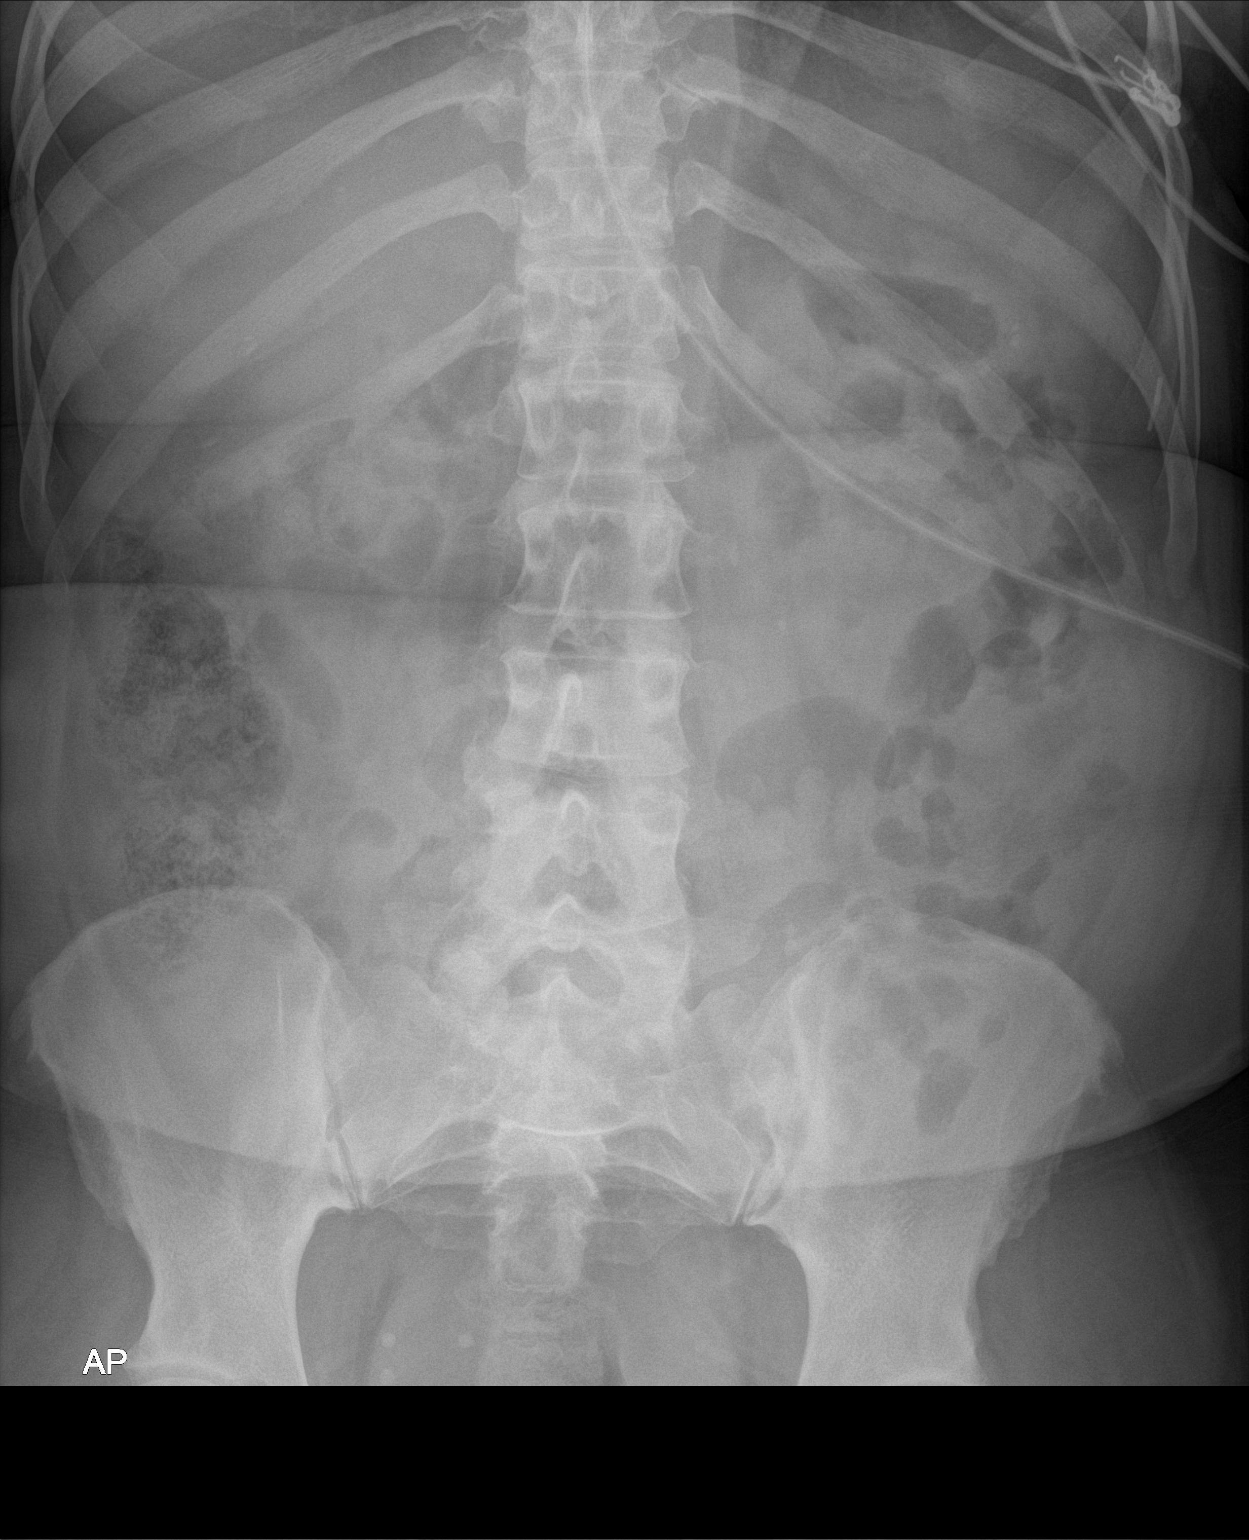

[abdomen kub (2 of 2)]
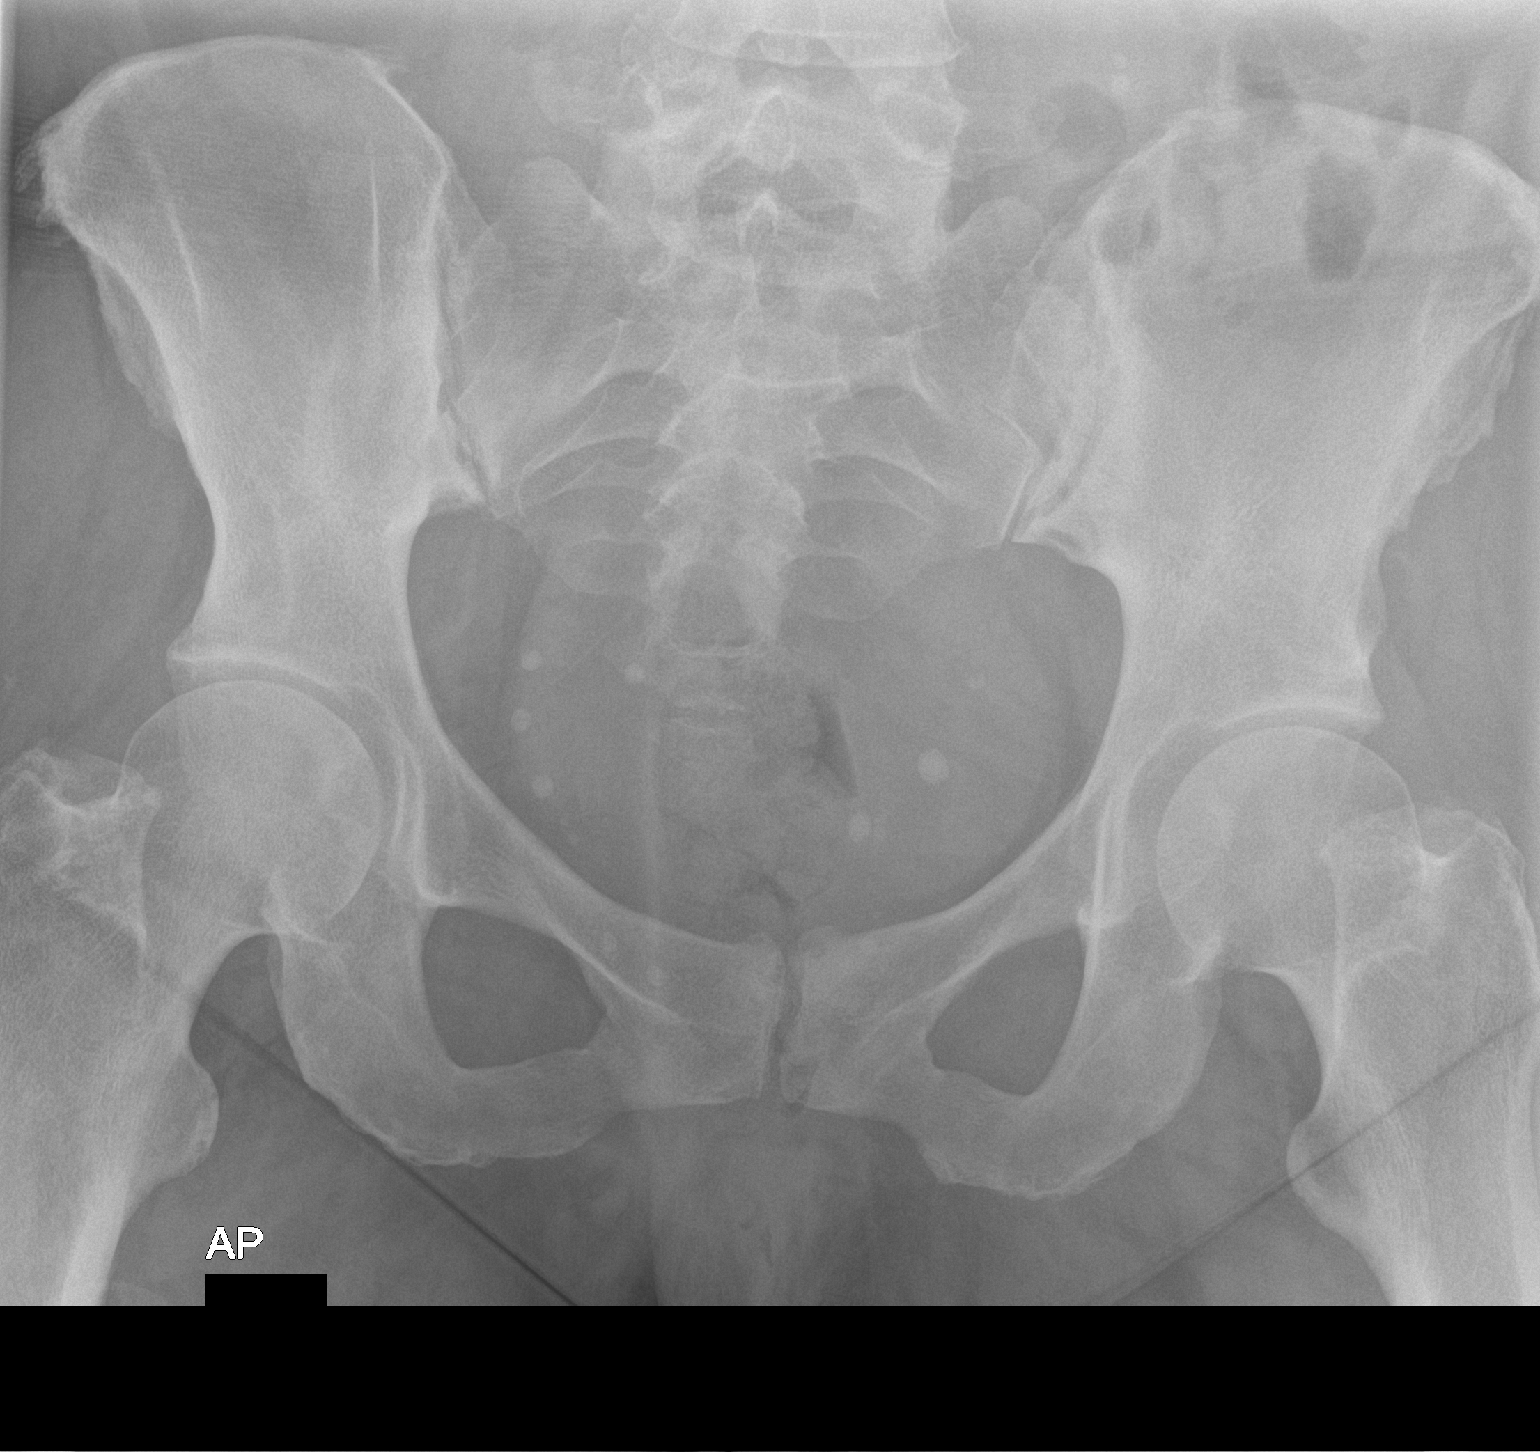

[2 of 2 positions shown; findings below may reference images not displayed]

FINDINGS: Calcified phleboliths in the pelvis. There is a non obstructive
bowel gas pattern. No supine evidence of free air. No organomegaly
or suspicious calcification. No acute bony abnormality.
IMPRESSION: No acute findings.

## 2018-07-19 MED ORDER — DIPHENHYDRAMINE HCL 25 MG PO CAPS: 25.0000 mg | ORAL_CAPSULE | Freq: Four times a day (QID) | ORAL | Status: DC | PRN

## 2018-07-19 MED ORDER — ONDANSETRON HCL 4 MG/2ML IJ SOLN
4.0000 mg | Freq: Once | INTRAMUSCULAR | Status: AC
  Administered 2018-07-19: 4 mg via INTRAVENOUS
  Filled 2018-07-19: qty 2

## 2018-07-19 MED ORDER — METOPROLOL TARTRATE 5 MG/5ML IV SOLN: 5.0000 mg | Freq: Four times a day (QID) | INTRAVENOUS | Status: DC | PRN

## 2018-07-19 MED ORDER — ONDANSETRON HCL 4 MG/2ML IJ SOLN: 4.0000 mg | Freq: Four times a day (QID) | INTRAMUSCULAR | Status: DC | PRN

## 2018-07-19 MED ORDER — SODIUM CHLORIDE 0.9 % IV SOLN
INTRAVENOUS | Status: DC
  Administered 2018-07-19: 10:00:00 via INTRAVENOUS

## 2018-07-19 MED ORDER — POLYETHYLENE GLYCOL 3350 17 G PO PACK: 17.0000 g | PACK | Freq: Every day | ORAL | Status: DC | PRN

## 2018-07-19 MED ORDER — ENOXAPARIN SODIUM 40 MG/0.4ML ~~LOC~~ SOLN
40.0000 mg | SUBCUTANEOUS | Status: DC
  Administered 2018-07-20: 40 mg via SUBCUTANEOUS
  Filled 2018-07-19: qty 0.4

## 2018-07-19 MED ORDER — MORPHINE SULFATE (PF) 2 MG/ML IV SOLN
2.0000 mg | INTRAVENOUS | Status: DC | PRN
  Administered 2018-07-19: 2 mg via INTRAVENOUS
  Filled 2018-07-19: qty 1

## 2018-07-19 MED ORDER — ACETAMINOPHEN 325 MG PO TABS
650.0000 mg | ORAL_TABLET | Freq: Four times a day (QID) | ORAL | Status: DC | PRN
  Administered 2018-07-20 – 2018-07-21 (×3): 650 mg via ORAL
  Filled 2018-07-19 (×3): qty 2

## 2018-07-19 MED ORDER — DOCUSATE SODIUM 100 MG PO CAPS
100.0000 mg | ORAL_CAPSULE | Freq: Two times a day (BID) | ORAL | Status: DC
  Administered 2018-07-20 – 2018-07-21 (×2): 100 mg via ORAL
  Filled 2018-07-19 (×2): qty 1

## 2018-07-19 MED ORDER — FENTANYL CITRATE (PF) 100 MCG/2ML IJ SOLN
50.0000 ug | Freq: Once | INTRAMUSCULAR | Status: AC
  Administered 2018-07-19: 50 ug via INTRAVENOUS
  Filled 2018-07-19: qty 2

## 2018-07-19 MED ORDER — FENTANYL CITRATE (PF) 100 MCG/2ML IJ SOLN
50.0000 ug | Freq: Once | INTRAMUSCULAR | Status: AC
Start: 2018-07-19 — End: 2018-07-19
  Administered 2018-07-19: 50 ug via INTRAVENOUS
  Filled 2018-07-19: qty 2

## 2018-07-19 MED ORDER — POTASSIUM CHLORIDE IN NACL 20-0.45 MEQ/L-% IV SOLN
INTRAVENOUS | Status: DC
  Administered 2018-07-19 – 2018-07-20 (×2): via INTRAVENOUS
  Filled 2018-07-19 (×3): qty 1000

## 2018-07-19 MED ORDER — ACETAMINOPHEN 650 MG RE SUPP: 650.0000 mg | Freq: Four times a day (QID) | RECTAL | Status: DC | PRN

## 2018-07-19 MED ORDER — SODIUM CHLORIDE 0.9 % IV BOLUS
1000.0000 mL | Freq: Once | INTRAVENOUS | Status: AC
  Administered 2018-07-19: 1000 mL via INTRAVENOUS

## 2018-07-19 MED ORDER — HYDROMORPHONE HCL 1 MG/ML IJ SOLN: 1.0000 mg | INTRAMUSCULAR | Status: DC | PRN

## 2018-07-19 MED ORDER — HYDROMORPHONE HCL 1 MG/ML IJ SOLN
1.0000 mg | INTRAMUSCULAR | Status: DC | PRN
  Administered 2018-07-19: 1 mg via INTRAVENOUS
  Filled 2018-07-19: qty 1

## 2018-07-19 MED ORDER — METHOCARBAMOL 500 MG PO TABS
500.0000 mg | ORAL_TABLET | Freq: Four times a day (QID) | ORAL | Status: DC | PRN
  Administered 2018-07-20 – 2018-07-21 (×2): 500 mg via ORAL
  Filled 2018-07-19 (×2): qty 1

## 2018-07-19 MED ORDER — ONDANSETRON 4 MG PO TBDP: 4.0000 mg | ORAL_TABLET | Freq: Four times a day (QID) | ORAL | Status: DC | PRN

## 2018-07-19 MED ORDER — OXYCODONE HCL 5 MG PO TABS
5.0000 mg | ORAL_TABLET | ORAL | Status: DC | PRN
  Administered 2018-07-20: 10 mg via ORAL
  Filled 2018-07-19: qty 2

## 2018-07-19 MED ORDER — HYDROCHLOROTHIAZIDE 25 MG PO TABS
25.0000 mg | ORAL_TABLET | Freq: Every day | ORAL | Status: DC
  Administered 2018-07-21: 25 mg via ORAL
  Filled 2018-07-19: qty 1

## 2018-07-19 MED ORDER — SIMETHICONE 80 MG PO CHEW: 40.0000 mg | CHEWABLE_TABLET | Freq: Four times a day (QID) | ORAL | Status: DC | PRN

## 2018-07-19 MED ORDER — DIPHENHYDRAMINE HCL 50 MG/ML IJ SOLN: 25.0000 mg | Freq: Four times a day (QID) | INTRAMUSCULAR | Status: DC | PRN

## 2018-07-19 NOTE — ED Notes (Signed)
Got patient up to ambulate to bathroom. Pt walked a few steps then became suddenly dizzy and almost fell. Pt returned to bed and will try bedside commode.

## 2018-07-19 NOTE — Progress Notes (Signed)
Chaplain came to meet patient and respond to her request for prayer.  Patient is having surgery in the morning and wanted prayer -I prayed for her to have peace of heart and mind and a restful evening and for her and her medical team to have great results from surgery and for smooth healing process.Phebe Colla, Chaplain   07/19/18 1900  Clinical Encounter Type  Visited With Patient  Visit Type Spiritual support;Pre-op  Referral From Patient  Consult/Referral To Chaplain  Spiritual Encounters  Spiritual Needs Prayer;Emotional  Stress Factors  Patient Stress Factors Other (Comment) (surgery in a.m.)

## 2018-07-19 NOTE — ED Triage Notes (Signed)
Per [facility]: Patient to ED from work following witnessed syncopal episodes. Patient reports she woke up diaphoretic and dizzy this morning, also endorses episode of chest pain radiating to right side of chest that has since resolved. Patient A&O x 4.

## 2018-07-19 NOTE — ED Notes (Signed)
Patient transported to Ultrasound 

## 2018-07-19 NOTE — H&P (Signed)
[facility] Admission Note  [facility] 1961/06/12  [id number].    Requesting MD: Dorie Rank Chief Complaint/Reason for Consult: biliary colic  HPI:  Theresa Case is a 58yo female who presented to [facility] earlier today with worsening abdominal pain. States that she started having epigastric abdominal pain last night after dinner, and ever since the pain has gradually gotten worse. Associated with nausea and sweats. Denies emesis, diarrhea, or fevers. States that she had a similar episode about 3 weeks ago, but the pain resolved without intervention.   ED workup included u/s which showed small stones dependent in the gallbladder but no sonographic evidence of cholecystitis or obstruction, mild fatty liver. WBC 7.1, AST 251, ALT 171, alk phos 83, Tbili 0.4. Patient states that her PCP has told her that her LFTs have been slightly elevated in the past. General surgery asked to see.  -PMH significant for HTN -Abdominal surgical history: hysterectomy, Left colectomy ~2010 in [place] -States that she had an EGD a few years ago that had no findings -Anticoagulants: none -Nonsmoker -Drinks alcohol occassionally -Employment: phlebotomist  ROS: Review of Systems  Constitutional: Positive for diaphoresis.  HENT: Negative.   Eyes: Negative.   Respiratory: Negative.  Negative for shortness of breath.   Cardiovascular: Negative.  Negative for chest pain.  Gastrointestinal: Positive for abdominal pain and nausea. Negative for constipation, diarrhea and vomiting.  Genitourinary: Negative.   Musculoskeletal: Negative.   Skin: Negative.   Neurological: Negative.     All systems reviewed and otherwise negative except for as above  Family History  Problem Relation Age of Onset  . Hypertension Mother   . Diabetes Mother   . Cancer Father   . Hyperlipidemia Brother     Past Medical History:  Diagnosis Date  . Hypertension   . Pseudotumor cerebri   . S/P left colectomy  with in last 10 years   2.5 feet    Past Surgical History:  Procedure Laterality Date  . ABDOMINAL HYSTERECTOMY    . BRAIN SURGERY    . TUBAL LIGATION      Social History:  reports that she has never smoked. She has never used smokeless tobacco. She reports that she does not drink alcohol or use drugs.  Allergies:  Allergies  Allergen Reactions  . Cephalexin Shortness Of Breath  . Cephalosporins Shortness Of Breath  . Magnesium Shortness Of Breath    PT states increased heart rate and difficulty breathing with IV Mag only.   . Magnesium Carbonate Shortness Of Breath    Rapid heart rate   . Magnesium Trisilicate Shortness Of Breath    Rapid heart rate  Rapid heart rate    . Magnesium-Containing Compounds Shortness Of Breath    Rapid heart rate   . Strawberry Extract Anaphylaxis, Other (See Comments) and Shortness Of Breath  . Verapamil Shortness Of Breath and Rash  . Aleve [Naproxen] Other (See Comments)    Heart race   . Codeine Other (See Comments)    Headache  "Very bad headache"  . Naproxen Sodium Other (See Comments)    Heart race   . Tape     Pulls skin off of softer skin (chest area, thigh area, ect)  . Tramadol Nausea And Vomiting    (Not in a hospital admission)   Prior to Admission medications   Medication Sig Start Date End Date Taking? Authorizing Provider  aspirin EC 81 MG tablet Take 1 tablet (81 mg total) by mouth daily. 02/26/18  Yes Harrington Challenger,  Carmin Muskrat, MD  ergocalciferol (VITAMIN D2) 50000 units capsule Take 50,000 Units by mouth once a week.   Yes [provider]  hydrochlorothiazide (HYDRODIURIL) 25 MG tablet Take 25 mg by mouth daily.   Yes [provider]  linaclotide (LINZESS) 290 MCG CAPS capsule Take 290 mcg by mouth daily as needed (constipation).    Yes [provider]  Multiple Vitamins-Minerals (MULTIVITAMIN WITH MINERALS) tablet Take 1 tablet by mouth daily.   Yes [provider]  metoprolol succinate  (TOPROL-XL) 25 MG 24 hr tablet Take 0.5 tablets (12.5 mg total) by mouth daily. Patient not taking: Reported on 07/19/2018 05/31/18   Fay Records, MD    Blood pressure (!) 129/91, pulse 84, temperature 98.1 F (36.7 C), temperature source Oral, resp. rate (!) 21, height 5' 7.5" (1.715 m), weight 108.4 kg, SpO2 100 %. Physical Exam: General: pleasant, WD/WN AA female who is laying in bed in bed and appears uncomfortable HEENT: head is normocephalic, atraumatic.  Sclera are noninjected.  Pupils equal and round.  Ears and nose without any masses or lesions.  Mouth is pink and moist. Dentition fair Heart: regular, rate, and rhythm.  No obvious murmurs, gallops, or rubs noted.  Palpable pedal pulses bilaterally Lungs: CTAB, no wheezes, rhonchi, or rales noted.  Respiratory effort nonlabored Abd: obese, well healed small midline and lower transverse incisions, soft, ND, +BS, no masses, hernias, or organomegaly. TTP epigastric region and RUQ MS: all 4 extremities are symmetrical with no cyanosis, clubbing, or edema. Skin: warm and dry with no masses, lesions, or rashes Psych: A&Ox3 with an appropriate affect. Neuro: cranial nerves grossly intact, extremity CSM intact bilaterally, normal speech  Results for orders placed or performed during the hospital encounter of 07/19/18 (from the past 48 hour(s))  CBG monitoring, ED     Status: None   Collection Time: 07/19/18  9:32 AM  Result Value Ref Range   Glucose-Capillary 94 70 - 99 mg/dL  Comprehensive metabolic panel     Status: Abnormal   Collection Time: 07/19/18  9:38 AM  Result Value Ref Range   Sodium 138 135 - 145 mmol/L   Potassium 3.1 (L) 3.5 - 5.1 mmol/L   Chloride 102 98 - 111 mmol/L   CO2 25 22 - 32 mmol/L   Glucose, Bld 101 (H) 70 - 99 mg/dL   BUN 9 6 - 20 mg/dL   Creatinine, Ser 0.69 0.44 - 1.00 mg/dL   Calcium 9.7 8.9 - 10.3 mg/dL   Total Protein 7.8 6.5 - 8.1 g/dL   Albumin 4.3 3.5 - 5.0 g/dL   AST 251 (H) 15 - 41 U/L   ALT  171 (H) 0 - 44 U/L   Alkaline Phosphatase 83 38 - 126 U/L   Total Bilirubin 0.4 0.3 - 1.2 mg/dL   GFR calc non Af Amer >60 >60 mL/min   GFR calc Af Amer >60 >60 mL/min   Anion gap 11 5 - 15    Comment: Performed at [facility], [address] [place], [place] [address]  Lipase, blood     Status: None   Collection Time: 07/19/18  9:38 AM  Result Value Ref Range   Lipase 23 11 - 51 U/L    Comment: Performed at [facility] [address], [place], [place] [address]  CBC WITH DIFFERENTIAL     Status: None   Collection Time: 07/19/18  9:38 AM  Result Value Ref Range   WBC 7.1  4.0 - 10.5 K/uL   RBC 4.60 3.87 - 5.11 MIL/uL   Hemoglobin 13.8 12.0 - 15.0 g/dL   HCT 41.4 36.0 - 46.0 %   MCV 90.0 80.0 - 100.0 fL   MCH 30.0 26.0 - 34.0 pg   MCHC 33.3 30.0 - 36.0 g/dL   RDW 12.8 11.5 - 15.5 %   Platelets 347 150 - 400 K/uL   nRBC 0.0 0.0 - 0.2 %   Neutrophils Relative % 67 %   Neutro Abs 4.8 1.7 - 7.7 K/uL   Lymphocytes Relative 26 %   Lymphs Abs 1.9 0.7 - 4.0 K/uL   Monocytes Relative 7 %   Monocytes Absolute 0.5 0.1 - 1.0 K/uL   Eosinophils Relative 0 %   Eosinophils Absolute 0.0 0.0 - 0.5 K/uL   Basophils Relative 0 %   Basophils Absolute 0.0 0.0 - 0.1 K/uL   Immature Granulocytes 0 %   Abs Immature Granulocytes 0.02 0.00 - 0.07 K/uL    Comment: Performed at [facility] [address] [place], [place] [address]  I-Stat beta hCG blood, ED     Status: Abnormal   Collection Time: 07/19/18  9:45 AM  Result Value Ref Range   I-stat hCG, quantitative 5.7 (H) <5 mIU/mL   Comment 3            Comment:   GEST. AGE      CONC.  (mIU/mL)   <=1 WEEK        5 - 50     2 WEEKS       50 - 500     3 WEEKS       100 - 10,000     4 WEEKS     1,000 - 30,000        FEMALE AND NON-PREGNANT FEMALE:     LESS THAN 5 mIU/mL   I-stat troponin, ED     Status: None   Collection Time: 07/19/18  9:45 AM  Result Value Ref Range   Troponin i, poc 0.00 0.00 - 0.08 ng/mL   Comment 3             Comment: Due to the release kinetics of cTnI, a negative result within the first hours of the onset of symptoms does not rule out myocardial infarction with certainty. If myocardial infarction is still suspected, repeat the test at appropriate intervals.   Urinalysis, Routine w reflex microscopic     Status: None   Collection Time: 07/19/18 10:55 AM  Result Value Ref Range   Color, Urine YELLOW YELLOW   APPearance CLEAR CLEAR   Specific Gravity, Urine 1.008 1.005 - 1.030   pH 7.0 5.0 - 8.0   Glucose, UA NEGATIVE NEGATIVE mg/dL   Hgb urine dipstick NEGATIVE NEGATIVE   Bilirubin Urine NEGATIVE NEGATIVE   Ketones, ur NEGATIVE NEGATIVE mg/dL   Protein, ur NEGATIVE NEGATIVE mg/dL   Nitrite NEGATIVE NEGATIVE   Leukocytes, UA NEGATIVE NEGATIVE    Comment: Performed at [facility] [address], [place], [place] [address]   US Abdomen Complete  Result Date: 07/19/2018 CLINICAL DATA:  Generalized upper abdominal pain beginning today. EXAM: ABDOMEN ULTRASOUND COMPLETE COMPARISON:  CT 07/01/2016 FINDINGS: Gallbladder: Small stones dependent in the gallbladder. No wall thickening. No surrounding fluid. No Murphy sign. Common bile duct: Diameter: 4 mm.  Normal. Liver: Echogenic liver suggesting fatty change. No focal lesion or ductal dilatation. Portal vein is patent on color Doppler imaging with normal  direction of blood flow towards the liver. IVC: No abnormality visualized. Pancreas: Poorly seen because of overlying bowel gas. Spleen: Size and appearance within normal limits. Right Kidney: Length: 11.6 cm. Echogenicity within normal limits. No mass or hydronephrosis visualized. Left Kidney: Length: 11.8 cm. Echogenicity within normal limits. No mass or hydronephrosis visualized. Abdominal aorta: No aneurysm visualized. Other findings: No ascites IMPRESSION: Small stones dependent in the gallbladder, but no sonographic evidence of cholecystitis or obstruction. Mild fatty liver.  Electronically Signed   By: Nelson Chimes M.D.   On: 07/19/2018 12:45   Dg Chest Portable 1 View  Result Date: 07/19/2018 CLINICAL DATA:  Epigastric pain and severe dizziness EXAM: PORTABLE CHEST 1 VIEW COMPARISON:  None. FINDINGS: Normal heart size. Lungs are under aerated and grossly clear. Normal vascularity. No pneumothorax or pleural effusion. IMPRESSION: No active disease. Electronically Signed   By: Marybelle Killings M.D.   On: 07/19/2018 10:04   Dg Abd Portable 1 View  Result Date: 07/19/2018 CLINICAL DATA:  Epigastric pain EXAM: PORTABLE ABDOMEN - 1 VIEW COMPARISON:  CT 07/01/2016 FINDINGS: Calcified phleboliths in the pelvis. There is a non obstructive bowel gas pattern. No supine evidence of free air. No organomegaly or suspicious calcification. No acute bony abnormality. IMPRESSION: No acute findings. Electronically Signed   By: Rolm Baptise M.D.   On: 07/19/2018 10:06      Assessment/Plan HTN Elevated LFTs - per patient her transaminases have been elevated before, may be due to fatty liver. No h/o hepatitis. Repeat labs in AM Hypokalemia - replace in IVF  Biliary colic - Patient with biliary colic and uncontrolled pain in the ED. Transaminases are also slightly elevated, otherwise WBC, lipase, and bilirubin WNL. Will admit to med-surg and plan for laparoscopic cholecystectomy tomorrow. [place] for clears today, NPO after midnight.  ID - [email or web address] VTE - SCDs, lovenox FEN - IVF, CLD, NPO after MN Foley - none  [place]ellington Hampshire, [facility] Surgery 07/19/2018, 1:44 PM Pager: [phone] Mon 7:00 am -11:30 AM Tues-Fri 7:00 am-4:30 pm Sat-Sun 7:00 am-11:30 am

## 2018-07-19 NOTE — ED Provider Notes (Addendum)
[facility] EMERGENCY DEPARTMENT Provider Note   CSN: [id number] Arrival date & time: 07/19/18  2395     History   Chief Complaint Chief Complaint  Patient presents with  . Loss of Consciousness  . Chest Pain    HPI Theresa Case is a 58 y.o. female.  HPI Patient presents to the emergency room for evaluation of epigastric pain.  Patient states it woke her up in the middle of the night.  He had pain in her epigastric region that radiated towards the right side.  She started having some nausea and vomiting with that as well.  Patient still went to work this morning.  While she was there she started having increasing pain and then had to syncopal episodes where coworkers had to help her to the ground.  Patient continues to have pain in her upper abdomen.  She is also feeling lightheaded and dizzy.  Whenever she opens her eyes and looks around she feels like the dizziness is worse.  She denies any headache.  No numbness or weakness.  No fevers or chills.   Past Medical History:  Diagnosis Date  . Hypertension   . Pseudotumor cerebri   . 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   Past Surgical History:  Procedure Laterality Date  . ABDOMINAL HYSTERECTOMY    . BRAIN SURGERY    . TUBAL LIGATION       OB History    Gravida  3   Para  3   Term      Preterm      AB      Living  3     SAB      TAB      Ectopic      Multiple      Live Births               Home Medications    Prior to Admission medications   Medication Sig Start Date End Date Taking? Authorizing Provider  aspirin EC 81 MG tablet Take 1 tablet (81 mg total) by mouth daily. 02/26/18  Yes Pricilla Riffle, MD  ergocalciferol (VITAMIN D2) 50000 units capsule Take 50,000 Units by mouth once a week.   Yes [provider]  hydrochlorothiazide (HYDRODIURIL) 25 MG tablet Take 25 mg by mouth daily.    Yes [provider]  linaclotide (LINZESS) 290 MCG CAPS capsule Take 290 mcg by mouth daily as needed (constipation).    Yes [provider]  Multiple Vitamins-Minerals (MULTIVITAMIN WITH MINERALS) tablet Take 1 tablet by mouth daily.   Yes [provider]  metoprolol succinate (TOPROL-XL) 25 MG 24 hr tablet Take 0.5 tablets (12.5 mg total) by mouth daily. Patient not taking: Reported on 07/19/2018 05/31/18   Pricilla Riffle, MD    Family History Family History  Problem Relation Age of Onset  . Hypertension Mother   . Diabetes Mother   . Cancer Father   . Hyperlipidemia Brother     Social History Social History   Tobacco Use  . Smoking status: Never Smoker  . Smokeless tobacco: Never Used  Substance Use Topics  . Alcohol use: No  . Drug use: No     Allergies   Cephalexin; Cephalosporins; Magnesium; Magnesium carbonate; Magnesium trisilicate; Magnesium-containing compounds; Strawberry extract; Verapamil; Aleve [naproxen]; Codeine; Naproxen sodium; Tape; and Tramadol   Review of Systems  Review of Systems  All other systems reviewed and are negative.    Physical Exam Updated Vital Signs BP (!) 132/91   Pulse 78   Temp 98.1 F (36.7 C) (Oral)   Resp 10   Ht 1.715 m (5' 7.5")   Wt 108.4 kg   SpO2 99%   BMI 36.88 kg/m   Physical Exam Vitals signs and nursing note reviewed.  Constitutional:      Appearance: She is well-developed. She is ill-appearing.  HENT:     Head: Normocephalic and atraumatic.     Right Ear: External ear normal.     Left Ear: External ear normal.  Eyes:     General: No scleral icterus.       Right eye: No discharge.        Left eye: No discharge.     Conjunctiva/sclera: Conjunctivae normal.  Neck:     Musculoskeletal: Neck supple.     Trachea: No tracheal deviation.  Cardiovascular:     Rate and Rhythm: Normal rate and regular rhythm.  Pulmonary:     Effort: Pulmonary effort is normal. No respiratory distress.      Breath sounds: Normal breath sounds. No stridor. No wheezing or rales.  Abdominal:     General: Bowel sounds are normal. There is no distension.     Palpations: Abdomen is soft.     Tenderness: There is abdominal tenderness in the right upper quadrant and epigastric area. There is guarding. There is no rebound.  Musculoskeletal:        General: No tenderness.  Skin:    General: Skin is warm and dry.     Findings: No rash.  Neurological:     Mental Status: She is alert.     Cranial Nerves: No cranial nerve deficit (no facial droop, extraocular movements intact, no slurred speech).     Sensory: No sensory deficit.     Motor: No abnormal muscle tone or seizure activity.     Coordination: Coordination normal.      ED Treatments / Results  Labs (all labs ordered are listed, but only abnormal results are displayed) Labs Reviewed  COMPREHENSIVE METABOLIC PANEL - Abnormal; Notable for the following components:      Result Value   Potassium 3.1 (*)    Glucose, Bld 101 (*)    AST 251 (*)    ALT 171 (*)    All other components within normal limits  I-STAT BETA HCG BLOOD, ED ([facility], [facility], [facility] ONLY) - Abnormal; Notable for the following components:   I-stat hCG, quantitative 5.7 (*)    All other components within normal limits  LIPASE, BLOOD  CBC WITH DIFFERENTIAL/PLATELET  URINALYSIS, ROUTINE W REFLEX MICROSCOPIC  I-STAT TROPONIN, ED  CBG MONITORING, ED    EKG EKG Interpretation  Date/Time:  Monday July 19 2018 09:28:40 EST Ventricular Rate:  103 PR Interval:    QRS Duration: 92 QT Interval:  344 QTC Calculation: 451 R Axis:   21 Text Interpretation:  Sinus tachycardia Since last tracing rate faster Confirmed by Linwood Dibbles [phone]) on 07/19/2018 9:34:40 AM   Radiology US Abdomen Complete  Result Date: 07/19/2018 CLINICAL DATA:  Generalized upper abdominal pain beginning today. EXAM: ABDOMEN ULTRASOUND COMPLETE COMPARISON:  CT 07/01/2016 FINDINGS: Gallbladder: Small stones  dependent in the gallbladder. No wall thickening. No surrounding fluid. No Murphy sign. Common bile duct: Diameter: 4 mm.  Normal. Liver: Echogenic liver suggesting fatty change. No focal lesion or ductal dilatation. Portal vein is patent  on color Doppler imaging with normal direction of blood flow towards the liver. IVC: No abnormality visualized. Pancreas: Poorly seen because of overlying bowel gas. Spleen: Size and appearance within normal limits. Right Kidney: Length: 11.6 cm. Echogenicity within normal limits. No mass or hydronephrosis visualized. Left Kidney: Length: 11.8 cm. Echogenicity within normal limits. No mass or hydronephrosis visualized. Abdominal aorta: No aneurysm visualized. Other findings: No ascites IMPRESSION: Small stones dependent in the gallbladder, but no sonographic evidence of cholecystitis or obstruction. Mild fatty liver. Electronically Signed   By: Paulina FusiMark  Shogry M.D.   On: 07/19/2018 12:45   Dg Chest Portable 1 View  Result Date: 07/19/2018 CLINICAL DATA:  Epigastric pain and severe dizziness EXAM: PORTABLE CHEST 1 VIEW COMPARISON:  None. FINDINGS: Normal heart size. Lungs are under aerated and grossly clear. Normal vascularity. No pneumothorax or pleural effusion. IMPRESSION: No active disease. Electronically Signed   By: Jolaine ClickArthur  Hoss M.D.   On: 07/19/2018 10:04   Dg Abd Portable 1 View  Result Date: 07/19/2018 CLINICAL DATA:  Epigastric pain EXAM: PORTABLE ABDOMEN - 1 VIEW COMPARISON:  CT 07/01/2016 FINDINGS: Calcified phleboliths in the pelvis. There is a non obstructive bowel gas pattern. No supine evidence of free air. No organomegaly or suspicious calcification. No acute bony abnormality. IMPRESSION: No acute findings. Electronically Signed   By: Charlett NoseKevin  Dover M.D.   On: 07/19/2018 10:06    Procedures Procedures (including critical care time)  Medications Ordered in ED Medications  sodium chloride 0.9 % bolus 1,000 mL (0 mLs Intravenous Stopped 07/19/18 1048)    And   0.9 %  sodium chloride infusion ( Intravenous New Bag/Given 07/19/18 1029)  fentaNYL (SUBLIMAZE) injection 50 mcg (has no administration in time range)  ondansetron (ZOFRAN) injection 4 mg (4 mg Intravenous Given 07/19/18 0946)  fentaNYL (SUBLIMAZE) injection 50 mcg (50 mcg Intravenous Given 07/19/18 0946)     Initial Impression / Assessment and Plan / ED Course  I have reviewed the triage vital signs and the nursing notes.  Pertinent labs & imaging results that were available during my care of the patient were reviewed by me and considered in my medical decision making (see chart for details).  Clinical Course as of Jul 21 818  Mon Jul 19, 2018  1306 Labs were notable for elevated LFTs.  Patient's ultrasound does demonstrate gallstones.  No signs of leukocytosis or gallbladder wall thickening however she still has persistent tenderness on exam.  I will consult general surgery   [JK]  1312 Note: HCG reviewed.  Clinically insignificant.  Doubt pregnancy   [JK]    Clinical Course User Index [JK] Linwood DibblesKnapp, Marilee Ditommaso, MD    Final Clinical Impressions(s) / ED Diagnoses   Final diagnoses:  Biliary colic      Linwood DibblesKnapp, Jamiesha Victoria, MD 07/19/18 1308    Linwood DibblesKnapp, Donte Lenzo, MD 07/21/18 [phone]

## 2018-07-19 NOTE — Plan of Care (Signed)
  Problem: Clinical Measurements: Goal: Ability to maintain clinical measurements within normal limits will improve Outcome: Progressing Goal: Will remain free from infection Outcome: Progressing Goal: Diagnostic test results will improve Outcome: Progressing Goal: Respiratory complications will improve Outcome: Progressing Goal: Cardiovascular complication will be avoided Outcome: Progressing   Problem: Activity: Goal: Risk for activity intolerance will decrease Outcome: Progressing   Problem: Elimination: Goal: Will not experience complications related to bowel motility Outcome: Progressing   Problem: Pain Managment: Goal: General experience of comfort will improve Outcome: Progressing   Problem: Safety: Goal: Ability to remain free from injury will improve Outcome: Progressing

## 2018-07-19 NOTE — ED Notes (Signed)
Pt ask for assistance to use the restroom, but she felt dizzy walking and almost fell. Pt use a bedside commode. Volume of urine was .

## 2018-07-20 ENCOUNTER — Observation Stay (HOSPITAL_COMMUNITY): Payer: Managed Care, Other (non HMO)

## 2018-07-20 ENCOUNTER — Observation Stay (HOSPITAL_COMMUNITY): Payer: Managed Care, Other (non HMO) | Admitting: Registered Nurse

## 2018-07-20 ENCOUNTER — Encounter (HOSPITAL_COMMUNITY)
Admission: EM | Disposition: A | Discharge status: Home / Self Care | Payer: Self-pay | Source: Home / Self Care | Attending: Emergency Medicine

## 2018-07-20 ENCOUNTER — Encounter (HOSPITAL_COMMUNITY): Payer: Self-pay | Admitting: Orthopedic Surgery

## 2018-07-20 HISTORY — PX: CHOLECYSTECTOMY: SHX55

## 2018-07-20 LAB — CBC
HCT: 36 % (ref 36.0–46.0)
Hemoglobin: 12 g/dL (ref 12.0–15.0)
MCH: 30.1 pg (ref 26.0–34.0)
MCHC: 33.3 g/dL (ref 30.0–36.0)
MCV: 90.2 fL (ref 80.0–100.0)
Platelets: 286 10*3/uL (ref 150–400)
RBC: 3.99 MIL/uL (ref 3.87–5.11)
RDW: 13.1 % (ref 11.5–15.5)
WBC: 4.4 10*3/uL (ref 4.0–10.5)
nRBC: 0 % (ref 0.0–0.2)

## 2018-07-20 LAB — COMPREHENSIVE METABOLIC PANEL
ALK PHOS: 69 U/L (ref 38–126)
ALT: 108 U/L — ABNORMAL HIGH (ref 0–44)
ANION GAP: 8 (ref 5–15)
AST: 76 U/L — ABNORMAL HIGH (ref 15–41)
Albumin: 3.5 g/dL (ref 3.5–5.0)
BUN: 7 mg/dL (ref 6–20)
CO2: 25 mmol/L (ref 22–32)
Calcium: 8.9 mg/dL (ref 8.9–10.3)
Chloride: 105 mmol/L (ref 98–111)
Creatinine, Ser: 0.78 mg/dL (ref 0.44–1.00)
GFR calc Af Amer: >60 mL/min (ref >60)
GFR calc non Af Amer: >60 mL/min (ref >60)
Glucose, Bld: 107 mg/dL — ABNORMAL HIGH (ref 70–99)
Potassium: 3.3 mmol/L — ABNORMAL LOW (ref 3.5–5.1)
SODIUM: 138 mmol/L (ref 135–145)
Total Bilirubin: 0.5 mg/dL (ref 0.3–1.2)
Total Protein: 6.3 g/dL — ABNORMAL LOW (ref 6.5–8.1)

## 2018-07-20 LAB — HIV ANTIBODY (ROUTINE TESTING W REFLEX): HIV Screen 4th Generation wRfx: NONREACTIVE

## 2018-07-20 LAB — SURGICAL PCR SCREEN
MRSA, PCR: NEGATIVE
Staphylococcus aureus: NEGATIVE

## 2018-07-20 SURGERY — LAPAROSCOPIC CHOLECYSTECTOMY WITH INTRAOPERATIVE CHOLANGIOGRAM
Anesthesia: General | Site: Abdomen

## 2018-07-20 MED ORDER — ROCURONIUM BROMIDE 50 MG/5ML IV SOSY
PREFILLED_SYRINGE | INTRAVENOUS | Status: DC | PRN
  Administered 2018-07-20: 50 mg via INTRAVENOUS

## 2018-07-20 MED ORDER — SCOPOLAMINE 1 MG/3DAYS TD PT72
1.0000 | MEDICATED_PATCH | TRANSDERMAL | Status: DC
  Administered 2018-07-20: 1.5 mg via TRANSDERMAL
  Filled 2018-07-20 (×2): qty 1

## 2018-07-20 MED ORDER — ACETAMINOPHEN 500 MG PO TABS
1000.0000 mg | ORAL_TABLET | Freq: Once | ORAL | Status: AC
  Administered 2018-07-20: 1000 mg via ORAL
  Filled 2018-07-20: qty 2

## 2018-07-20 MED ORDER — HYDROCODONE-ACETAMINOPHEN 7.5-325 MG PO TABS: 1.0000 | ORAL_TABLET | Freq: Once | ORAL | Status: DC | PRN

## 2018-07-20 MED ORDER — DEXMEDETOMIDINE HCL 200 MCG/2ML IV SOLN
INTRAVENOUS | Status: DC | PRN
  Administered 2018-07-20 (×3): 8 ug via INTRAVENOUS
  Administered 2018-07-20: 16 ug via INTRAVENOUS

## 2018-07-20 MED ORDER — MEPERIDINE HCL 50 MG/ML IJ SOLN: 6.2500 mg | INTRAMUSCULAR | Status: DC | PRN

## 2018-07-20 MED ORDER — DEXAMETHASONE SODIUM PHOSPHATE 10 MG/ML IJ SOLN
INTRAMUSCULAR | Status: AC
  Filled 2018-07-20: qty 1

## 2018-07-20 MED ORDER — CIPROFLOXACIN IN D5W 400 MG/200ML IV SOLN
400.0000 mg | Freq: Two times a day (BID) | INTRAVENOUS | Status: DC
  Administered 2018-07-20: 400 mg via INTRAVENOUS

## 2018-07-20 MED ORDER — ONDANSETRON HCL 4 MG/2ML IJ SOLN
INTRAMUSCULAR | Status: DC | PRN
  Administered 2018-07-20: 4 mg via INTRAVENOUS

## 2018-07-20 MED ORDER — ONDANSETRON HCL 4 MG/2ML IJ SOLN: 4.0000 mg | Freq: Once | INTRAMUSCULAR | Status: DC | PRN

## 2018-07-20 MED ORDER — BUPIVACAINE-EPINEPHRINE 0.25% -1:200000 IJ SOLN
INTRAMUSCULAR | Status: DC | PRN
  Administered 2018-07-20: 13 mL

## 2018-07-20 MED ORDER — IOPAMIDOL (ISOVUE-300) INJECTION 61%
INTRAVENOUS | Status: AC
  Filled 2018-07-20: qty 50

## 2018-07-20 MED ORDER — LACTATED RINGERS IV SOLN
INTRAVENOUS | Status: DC
  Administered 2018-07-20 (×2): via INTRAVENOUS

## 2018-07-20 MED ORDER — GABAPENTIN 300 MG PO CAPS
300.0000 mg | ORAL_CAPSULE | Freq: Once | ORAL | Status: AC
  Administered 2018-07-20: 300 mg via ORAL
  Filled 2018-07-20: qty 1

## 2018-07-20 MED ORDER — MIDAZOLAM HCL 2 MG/2ML IJ SOLN
INTRAMUSCULAR | Status: AC
  Filled 2018-07-20: qty 2

## 2018-07-20 MED ORDER — ALBUTEROL SULFATE (2.5 MG/3ML) 0.083% IN NEBU: 2.5000 mg | INHALATION_SOLUTION | Freq: Once | RESPIRATORY_TRACT | Status: DC | PRN

## 2018-07-20 MED ORDER — 0.9 % SODIUM CHLORIDE (POUR BTL) OPTIME
TOPICAL | Status: DC | PRN
  Administered 2018-07-20: 1000 mL

## 2018-07-20 MED ORDER — FENTANYL CITRATE (PF) 100 MCG/2ML IJ SOLN
INTRAMUSCULAR | Status: DC | PRN
  Administered 2018-07-20: 25 ug via INTRAVENOUS
  Administered 2018-07-20: 125 ug via INTRAVENOUS

## 2018-07-20 MED ORDER — PROPOFOL 10 MG/ML IV BOLUS
INTRAVENOUS | Status: DC | PRN
  Administered 2018-07-20: 170 mg via INTRAVENOUS

## 2018-07-20 MED ORDER — LIDOCAINE VISCOUS HCL 2 % MT SOLN
15.0000 mL | Freq: Four times a day (QID) | OROMUCOSAL | Status: DC | PRN
  Filled 2018-07-20: qty 15

## 2018-07-20 MED ORDER — LIDOCAINE 2% (20 MG/ML) 5 ML SYRINGE
INTRAMUSCULAR | Status: DC | PRN
  Administered 2018-07-20: 100 mg via INTRAVENOUS

## 2018-07-20 MED ORDER — DEXAMETHASONE SODIUM PHOSPHATE 10 MG/ML IJ SOLN
INTRAMUSCULAR | Status: DC | PRN
  Administered 2018-07-20: 10 mg via INTRAVENOUS

## 2018-07-20 MED ORDER — LIDOCAINE 2% (20 MG/ML) 5 ML SYRINGE
INTRAMUSCULAR | Status: AC
  Filled 2018-07-20: qty 5

## 2018-07-20 MED ORDER — HYDROMORPHONE HCL 1 MG/ML IJ SOLN: 0.2500 mg | INTRAMUSCULAR | Status: DC | PRN

## 2018-07-20 MED ORDER — SODIUM CHLORIDE 0.9 % IV SOLN
INTRAVENOUS | Status: DC | PRN
  Administered 2018-07-20: 100 mL

## 2018-07-20 MED ORDER — ROCURONIUM BROMIDE 50 MG/5ML IV SOSY
PREFILLED_SYRINGE | INTRAVENOUS | Status: AC
  Filled 2018-07-20: qty 5

## 2018-07-20 MED ORDER — MIDAZOLAM HCL 5 MG/5ML IJ SOLN
INTRAMUSCULAR | Status: DC | PRN
  Administered 2018-07-20: 2 mg via INTRAVENOUS

## 2018-07-20 MED ORDER — SUGAMMADEX SODIUM 200 MG/2ML IV SOLN
INTRAVENOUS | Status: DC | PRN
  Administered 2018-07-20 (×2): 100 mg via INTRAVENOUS

## 2018-07-20 MED ORDER — GUAIFENESIN-DM 100-10 MG/5ML PO SYRP: 5.0000 mL | ORAL_SOLUTION | Freq: Four times a day (QID) | ORAL | Status: DC | PRN

## 2018-07-20 MED ORDER — FENTANYL CITRATE (PF) 250 MCG/5ML IJ SOLN
INTRAMUSCULAR | Status: AC
  Filled 2018-07-20: qty 5

## 2018-07-20 MED ORDER — CIPROFLOXACIN IN D5W 400 MG/200ML IV SOLN
INTRAVENOUS | Status: AC
  Filled 2018-07-20: qty 200

## 2018-07-20 MED ORDER — ONDANSETRON HCL 4 MG/2ML IJ SOLN
INTRAMUSCULAR | Status: AC
  Filled 2018-07-20: qty 4

## 2018-07-20 MED ORDER — PROPOFOL 10 MG/ML IV BOLUS
INTRAVENOUS | Status: AC
  Filled 2018-07-20: qty 20

## 2018-07-20 MED ORDER — SODIUM CHLORIDE 0.9 % IR SOLN
Status: DC | PRN
  Administered 2018-07-20: 1000 mL

## 2018-07-20 MED ORDER — BUPIVACAINE-EPINEPHRINE (PF) 0.25% -1:200000 IJ SOLN
INTRAMUSCULAR | Status: AC
  Filled 2018-07-20: qty 30

## 2018-07-20 SURGICAL SUPPLY — 45 items
APPLIER CLIP ROT 10 11.4 M/L (STAPLE) ×3
BENZOIN TINCTURE PRP APPL 2/3 (GAUZE/BANDAGES/DRESSINGS) ×3 IMPLANT
BLADE CLIPPER SURG (BLADE) IMPLANT
CANISTER SUCT 3000ML PPV (MISCELLANEOUS) ×3 IMPLANT
CHLORAPREP W/TINT 26ML (MISCELLANEOUS) ×3 IMPLANT
CLIP APPLIE ROT 10 11.4 M/L (STAPLE) ×1 IMPLANT
CLOSURE WOUND 1/2 X4 (GAUZE/BANDAGES/DRESSINGS) ×1
COVER MAYO STAND STRL (DRAPES) ×3 IMPLANT
COVER SURGICAL LIGHT HANDLE (MISCELLANEOUS) ×3 IMPLANT
COVER WAND RF STERILE (DRAPES) ×1 IMPLANT
DRAPE C-ARM 42X72 X-RAY (DRAPES) ×3 IMPLANT
DRSG TEGADERM 2-3/8X2-3/4 SM (GAUZE/BANDAGES/DRESSINGS) ×9 IMPLANT
DRSG TEGADERM 4X4.75 (GAUZE/BANDAGES/DRESSINGS) ×3 IMPLANT
ELECT REM PT RETURN 9FT ADLT (ELECTROSURGICAL) ×3
ELECTRODE REM PT RTRN 9FT ADLT (ELECTROSURGICAL) ×1 IMPLANT
FILTER SMOKE EVAC LAPAROSHD (FILTER) ×3 IMPLANT
GAUZE SPONGE 2X2 8PLY STRL LF (GAUZE/BANDAGES/DRESSINGS) ×1 IMPLANT
GLOVE BIO SURGEON STRL SZ7 (GLOVE) ×3 IMPLANT
GLOVE BIOGEL PI IND STRL 7.5 (GLOVE) ×1 IMPLANT
GLOVE BIOGEL PI INDICATOR 7.5 (GLOVE) ×2
GOWN STRL REUS W/ TWL LRG LVL3 (GOWN DISPOSABLE) ×3 IMPLANT
GOWN STRL REUS W/TWL LRG LVL3 (GOWN DISPOSABLE) ×6
KIT BASIN OR (CUSTOM PROCEDURE TRAY) ×3 IMPLANT
KIT TURNOVER KIT B (KITS) ×3 IMPLANT
NS IRRIG 1000ML POUR BTL (IV SOLUTION) ×3 IMPLANT
PAD ARMBOARD 7.5X6 YLW CONV (MISCELLANEOUS) ×3 IMPLANT
POUCH RETRIEVAL ECOSAC 10 (ENDOMECHANICALS) IMPLANT
POUCH RETRIEVAL ECOSAC 10MM (ENDOMECHANICALS)
POUCH SPECIMEN RETRIEVAL 10MM (ENDOMECHANICALS) IMPLANT
SCISSORS LAP 5X35 DISP (ENDOMECHANICALS) ×3 IMPLANT
SET CHOLANGIOGRAPH 5 50 .035 (SET/KITS/TRAYS/PACK) ×3 IMPLANT
SET IRRIG TUBING LAPAROSCOPIC (IRRIGATION / IRRIGATOR) ×5 IMPLANT
SET TUBE SMOKE EVAC HIGH FLOW (TUBING) ×3 IMPLANT
SLEEVE ENDOPATH XCEL 5M (ENDOMECHANICALS) ×3 IMPLANT
SPECIMEN JAR SMALL (MISCELLANEOUS) ×3 IMPLANT
SPONGE GAUZE 2X2 STER 10/PKG (GAUZE/BANDAGES/DRESSINGS) ×2
STRIP CLOSURE SKIN 1/2X4 (GAUZE/BANDAGES/DRESSINGS) ×2 IMPLANT
SUT MNCRL AB 4-0 PS2 18 (SUTURE) ×3 IMPLANT
TOWEL OR 17X24 6PK STRL BLUE (TOWEL DISPOSABLE) ×3 IMPLANT
TOWEL OR 17X26 10 PK STRL BLUE (TOWEL DISPOSABLE) ×3 IMPLANT
TRAY LAPAROSCOPIC MC (CUSTOM PROCEDURE TRAY) ×3 IMPLANT
TROCAR XCEL BLUNT TIP 100MML (ENDOMECHANICALS) ×3 IMPLANT
TROCAR XCEL NON-BLD 11X100MML (ENDOMECHANICALS) ×3 IMPLANT
TROCAR XCEL NON-BLD 5MMX100MML (ENDOMECHANICALS) ×3 IMPLANT
WATER STERILE IRR 1000ML POUR (IV SOLUTION) ×3 IMPLANT

## 2018-07-20 NOTE — Progress Notes (Signed)
Subjective/Chief Complaint: Pain better but still requiring PRN pain meds No nausea or vomiting   Objective: Vital signs in last 24 hours: Temp:  [97.9 F (36.6 C)-98.4 F (36.9 C)] 97.9 F (36.6 C) (01/14 0611) Pulse Rate:  [71-103] 74 (01/14 0611) Resp:  [10-26] 18 (01/14 0611) BP: (106-145)/(67-92) 123/89 (01/14 0611) SpO2:  [92 %-100 %] 100 % (01/14 0611) Weight:  [108.4 kg] 108.4 kg (01/13 0935) Last BM Date: 07/19/18  Intake/Output from previous day: 01/13 0701 - 01/14 0700 In: 2449.8 [I.V.:1451.8; IV Piggyback:998] Out: 700 [Urine:700] Intake/Output this shift: No intake/output data recorded.  Moderate RUQ tenderness  Lab Results:  Recent Labs    07/19/18 0938 07/20/18 0224  WBC 7.1 4.4  HGB 13.8 12.0  HCT 41.4 36.0  PLT 347 286   BMET Recent Labs    07/19/18 0938 07/20/18 0224  NA 138 138  K 3.1* 3.3*  CL 102 105  CO2 25 25  GLUCOSE 101* 107*  BUN 9 7  CREATININE 0.69 0.78  CALCIUM 9.7 8.9   Hepatic Function Latest Ref Rng & Units 07/20/2018 07/19/2018 07/01/2016  Total Protein 6.5 - 8.1 g/dL 6.3(L) 7.8 7.4  Albumin 3.5 - 5.0 g/dL 3.5 4.3 4.1  AST 15 - 41 U/L 76(H) 251(H) 36  ALT 0 - 44 U/L 108(H) 171(H) 52  Alk Phosphatase 38 - 126 U/L 69 83 55  Total Bilirubin 0.3 - 1.2 mg/dL 0.5 0.4 0.5    PT/INR No results for input(s): LABPROT, INR in the last 72 hours. ABG No results for input(s): PHART, HCO3 in the last 72 hours.  Invalid input(s): PCO2, PO2  Studies/Results: US Abdomen Complete  Result Date: 07/19/2018 CLINICAL DATA:  Generalized upper abdominal pain beginning today. EXAM: ABDOMEN ULTRASOUND COMPLETE COMPARISON:  CT 07/01/2016 FINDINGS: Gallbladder: Small stones dependent in the gallbladder. No wall thickening. No surrounding fluid. No Murphy sign. Common bile duct: Diameter: 4 mm.  Normal. Liver: Echogenic liver suggesting fatty change. No focal lesion or ductal dilatation. Portal vein is patent on color Doppler imaging with  normal direction of blood flow towards the liver. IVC: No abnormality visualized. Pancreas: Poorly seen because of overlying bowel gas. Spleen: Size and appearance within normal limits. Right Kidney: Length: 11.6 cm. Echogenicity within normal limits. No mass or hydronephrosis visualized. Left Kidney: Length: 11.8 cm. Echogenicity within normal limits. No mass or hydronephrosis visualized. Abdominal aorta: No aneurysm visualized. Other findings: No ascites IMPRESSION: Small stones dependent in the gallbladder, but no sonographic evidence of cholecystitis or obstruction. Mild fatty liver. Electronically Signed   By: Nelson Chimes M.D.   On: 07/19/2018 12:45   Dg Chest Portable 1 View  Result Date: 07/19/2018 CLINICAL DATA:  Epigastric pain and severe dizziness EXAM: PORTABLE CHEST 1 VIEW COMPARISON:  None. FINDINGS: Normal heart size. Lungs are under aerated and grossly clear. Normal vascularity. No pneumothorax or pleural effusion. IMPRESSION: No active disease. Electronically Signed   By: Marybelle Killings M.D.   On: 07/19/2018 10:04   Dg Abd Portable 1 View  Result Date: 07/19/2018 CLINICAL DATA:  Epigastric pain EXAM: PORTABLE ABDOMEN - 1 VIEW COMPARISON:  CT 07/01/2016 FINDINGS: Calcified phleboliths in the pelvis. There is a non obstructive bowel gas pattern. No supine evidence of free air. No organomegaly or suspicious calcification. No acute bony abnormality. IMPRESSION: No acute findings. Electronically Signed   By: Rolm Baptise M.D.   On: 07/19/2018 10:06    Anti-infectives: Anti-infectives (From admission, onward)   None  Assessment/Plan: Biliary colic/ possible early acute appendicitis  Plan laparoscopic cholecystectomy with IOC today.  The surgical procedure has been discussed with the patient.  Potential risks, benefits, alternative treatments, and expected outcomes have been explained.  All of the patient's questions at this time have been answered.  The likelihood of reaching the  patient's treatment goal is good.  The patient understand the proposed surgical procedure and wishes to proceed.   LOS: 0 days    Maia Petties 07/20/2018

## 2018-07-20 NOTE — Progress Notes (Signed)
Pt is comfortable and sats have remained in the high 90's (98-99) since earlier episode.  Pt only wants to take tylenol since the other pain meds give her a headache.  Will continue to monitor, pt states the area on the right side of her throat is now gone and she is able to breathe more easily.

## 2018-07-20 NOTE — Significant Event (Signed)
Rapid Response Event Note  Overview: Time Called: 1405 Arrival Time: 1408 Event Type: Respiratory  Initial Focused Assessment: Patient recently arrived from the PACU sitting up in bed coughing, she then began having laryngospasms  And became very distressed.  BP  112/85  HR 87  O2 sat 100% on 2L [place]ign Lung sounds wheezes  Interventions: Albuterol treatment given. [facility] PA at bedside to assess patient  Some improvement.  Lung sounds clear Patient still distressed and SOB after coughing. She describes having phlegm in the back of her throat. Placed on humidified air Drank some warm tea Patient improving and able to rest longer Now when she coughs she does not become distressed.  O2 sats remained 94-99% through out event. Patient able to stand comfortably at the side of the bed while changing bed linen  Patient resting comfortably now, call bell in hand, will call if assistance needed.    Plan of Care (if not transferred): Rn to call if assistance needed  Event Summary: Name of Physician Notified: Mattie Marlin   PA at      at    Outcome: Stayed in room and stabalized  Event End Time: 1522  Theresa Case

## 2018-07-20 NOTE — Progress Notes (Signed)
Pt admitted to 6N01 s/p Lap chole from PACU.

## 2018-07-20 NOTE — Progress Notes (Signed)
Case # [id number]  [employer] [place] [employer] if we need to call and get an update on the case for pt's daughter, Rogue Jury in the [employer].

## 2018-07-20 NOTE — Progress Notes (Signed)
Went into pt's room and she was having difficulty with coughing (non productively).  Pt felt tight and like something was choking her on the right side of her throat.  Rapid Response was called as well as Dr. Tacy Dura (her Anesthesiologis) who both came right up.  PAs for [facility] Almira Coaster and Shanda Bumps also came up.  Albuterol treatment given, followed by humidified air, hot tea. Continuous pulse oximetry started, pt sats were 100%.  All Lap sites were checked and were clean, dry and intact.  Pt states no pain in abd, that it felt much better.  Will continue to monitor and encourage IS use.

## 2018-07-20 NOTE — Op Note (Signed)
Laparoscopic Cholecystectomy Procedure Note  Indications: This patient presents with symptomatic gallbladder disease and will undergo laparoscopic cholecystectomy.  US showed no signs of cholecystitis and LFT's were mildly elevated with normal bilirubin.  She was fairly tender so we recommended cholecystectomy.  Pre-operative Diagnosis: Calculus of gallbladder with acute cholecystitis, without mention of obstruction  Post-operative Diagnosis: Same  Surgeon: Wynona LunaMatthew K Castle Lamons   Assistants: Leary RocaMichael Maczis, PA-C  Anesthesia: General endotracheal anesthesia  ASA Class: 2  Procedure Details  The patient was seen again in the Holding Room. The risks, benefits, complications, treatment options, and expected outcomes were discussed with the patient. The possibilities of reaction to medication, pulmonary aspiration, perforation of viscus, bleeding, recurrent infection, finding a normal gallbladder, the need for additional procedures, failure to diagnose a condition, the possible need to convert to an open procedure, and creating a complication requiring transfusion or operation were discussed with the patient. The likelihood of improving the patient's symptoms with return to their baseline status is good.  The patient and/or family concurred with the proposed plan, giving informed consent. The site of surgery properly noted. The patient was taken to Operating Room, [facility] and the procedure verified as Laparoscopic Cholecystectomy with Intraoperative Cholangiogram. A Time Out was held and the above information confirmed.  Prior to the induction of general anesthesia, antibiotic prophylaxis was administered. General endotracheal anesthesia was then administered and tolerated well. After the induction, the abdomen was prepped with Chloraprep and draped in sterile fashion. The patient was positioned in the supine position.  Local anesthetic agent was injected into the skin below the  umbilicus in the old scar and an incision made. We dissected down to the abdominal fascia with blunt dissection.  The fascia was incised vertically and we entered the peritoneal cavity bluntly.  A pursestring suture of 0-Vicryl was placed around the fascial opening.  The Hasson cannula was inserted and secured with the stay suture.  Pneumoperitoneum was then created with CO2 and tolerated well without any adverse changes in the patient's vital signs. An 11-mm port was placed in the subxiphoid position.  Two 5-mm ports were placed in the right upper quadrant. All skin incisions were infiltrated with a local anesthetic agent before making the incision and placing the trocars.   We positioned the patient in reverse Trendelenburg, tilted slightly to the patient's left.  The gallbladder was identified, the fundus grasped and retracted cephalad. Adhesions were lysed bluntly and with the electrocautery where indicated, taking care not to injure any adjacent organs or viscus. The infundibulum was grasped and retracted laterally, exposing the peritoneum overlying the triangle of Calot. This was then divided and exposed in a blunt fashion. The cystic duct was clearly identified and bluntly dissected circumferentially. A critical view of the cystic duct and cystic artery was obtained.  I placed a clip proximally on the gallbladder.  We attempted a cholangiogram, but I was unable to pass a catheter into the cystic duct.  We milked a few tiny stones from the cystic duct.  The cystic duct was then ligated with clips and divided. The cystic artery was dissected free, ligated with clips and divided as well.   The gallbladder was dissected from the liver bed in retrograde fashion with the electrocautery. The gallbladder was removed and placed in an Endocatch sac. The liver bed was irrigated and inspected. Hemostasis was achieved with the electrocautery. Copious irrigation was utilized and was repeatedly aspirated until clear.   The gallbladder and Endocatch sac were  then removed through the umbilical port site.  The pursestring suture was used to close the umbilical fascia.    We again inspected the right upper quadrant for hemostasis.  Pneumoperitoneum was released as we removed the trocars.  4-0 Monocryl was used to close the skin.   Benzoin, steri-strips, and clean dressings were applied. The patient was then extubated and brought to the recovery room in stable condition. Instrument, sponge, and needle counts were correct at closure and at the conclusion of the case.   Findings: Cholecystitis with Cholelithiasis  Estimated Blood Loss: Minimal         Drains: none         Specimens: Gallbladder           Complications: None; patient tolerated the procedure well.         Disposition: PACU - hemodynamically stable.         Condition: stable  Wilmon Arms. Corliss Skains, MD, [facility] Surgery  General/ Trauma Surgery Beeper [phone]  07/20/2018 12:21 PM

## 2018-07-20 NOTE — Anesthesia Preprocedure Evaluation (Addendum)
Anesthesia Evaluation  Patient identified by MRN, date of birth, ID band Patient awake    Reviewed: Allergy & Precautions, NPO status , Patient's Chart, lab work & pertinent test results  History of Anesthesia Complications (+) PONV  Airway Mallampati: II  TM Distance: >3 FB Neck ROM: Full    Dental no notable dental hx. (+) Teeth Intact, Dental Advisory Given   Pulmonary former smoker,    Pulmonary exam normal breath sounds clear to auscultation       Cardiovascular hypertension, Pt. on medications and Pt. on home beta blockers Normal cardiovascular exam Rhythm:Regular Rate:Normal     Neuro/Psych  Headaches, negative psych ROS   GI/Hepatic GERD  ,  Endo/Other  negative endocrine ROS  Renal/GU negative Renal ROS     Musculoskeletal   Abdominal (+) + obese,   Peds  Hematology negative hematology ROS (+)   Anesthesia Other Findings   Reproductive/Obstetrics                            Anesthesia Physical Anesthesia Plan  ASA: II  Anesthesia Plan: General   Post-op Pain Management:    Induction: Intravenous  PONV Risk Score and Plan: 4 or greater and Ondansetron, Dexamethasone, Treatment may vary due to age or medical condition, Midazolam and Scopolamine patch - Pre-op  Airway Management Planned: Oral ETT  Additional Equipment:   Intra-op Plan:   Post-operative Plan: Extubation in OR  Informed Consent: I have reviewed the patients History and Physical, chart, labs and discussed the procedure including the risks, benefits and alternatives for the proposed anesthesia with the patient or authorized representative who has indicated his/her understanding and acceptance.     Dental advisory given  Plan Discussed with:   Anesthesia Plan Comments:         Anesthesia Quick Evaluation

## 2018-07-20 NOTE — Anesthesia Procedure Notes (Signed)
Procedure Name: Intubation Date/Time: 07/20/2018 11:03 AM Performed by: Trinna Post., CRNA Pre-anesthesia Checklist: Patient identified, Emergency Drugs available, Suction available, Patient being monitored and Timeout performed Patient Re-evaluated:Patient Re-evaluated prior to induction Oxygen Delivery Method: Circle system utilized Preoxygenation: Pre-oxygenation with 100% oxygen Induction Type: IV induction Ventilation: Mask ventilation without difficulty Laryngoscope Size: Mac and 3 Grade View: Grade I Tube type: Oral Tube size: 7.0 mm Number of attempts: 1 Airway Equipment and Method: Stylet Placement Confirmation: ETT inserted through vocal cords under direct vision and positive ETCO2 Secured at: 21 cm Tube secured with: Tape Dental Injury: Teeth and Oropharynx as per pre-operative assessment

## 2018-07-20 NOTE — Progress Notes (Signed)
Was called to pt's room due to respiratory distress. Per nurse pt came up from PACU and began having an episode of coughing that caused her difficulty breathing. When I entered the room the pt was breathing without difficulty, O2 sats 100% on RA, and lungs CTA. Pt had intermittent coughing during my exam. She states she feels like there is something stuck in her throat. Anesthesia was also present and believes she may be having laryngeo/bronchospasms. Nurse states pt had a albuterol neb and that seemed to help. Continue pulse ox and encourage IS. Please page [place] if she continues to have any difficulties.   Mattie Marlin, [facility] Pager [phone]

## 2018-07-20 NOTE — Transfer of Care (Signed)
Immediate Anesthesia Transfer of Care Note  Patient: Theresa Case  Procedure(s) Performed: LAPAROSCOPIC CHOLECYSTECTOMY WITH INTRAOPERATIVE CHOLANGIOGRAM (N/A Abdomen)  Patient Location: PACU  Anesthesia Type:General  Level of Consciousness: awake, alert  and oriented  Airway & Oxygen Therapy: Patient Spontanous Breathing and Patient connected to nasal cannula oxygen  Post-op Assessment: Report given to RN and Post -op Vital signs reviewed and stable  Post vital signs: Reviewed and stable  Last Vitals:  Vitals Value Taken Time  BP 106/75 07/20/2018 12:38 PM  Temp    Pulse 81 07/20/2018 12:39 PM  Resp 17 07/20/2018 12:39 PM  SpO2 100 % 07/20/2018 12:39 PM  Vitals shown include unvalidated device data.  Last Pain:  Vitals:   07/20/18 0920  TempSrc: Oral  PainSc:       Patients Stated Pain Goal: 2 (07/19/18 1731)  Complications: No apparent anesthesia complications

## 2018-07-20 NOTE — Progress Notes (Signed)
Pt to OR via bed, report given to Perry.

## 2018-07-20 NOTE — Anesthesia Postprocedure Evaluation (Signed)
Anesthesia Post Note  Patient: [employer]chant [employer] officer  Procedure(s) Performed: LAPAROSCOPIC CHOLECYSTECTOMY WITH INTRAOPERATIVE CHOLANGIOGRAM (N/A Abdomen)     Patient location during evaluation: PACU Anesthesia Type: General Level of consciousness: awake and alert Pain management: pain level controlled Vital Signs Assessment: post-procedure vital signs reviewed and stable Respiratory status: spontaneous breathing, nonlabored ventilation, respiratory function stable and patient connected to nasal cannula oxygen Cardiovascular status: blood pressure returned to baseline and stable Postop Assessment: no apparent nausea or vomiting Anesthetic complications: no    Last Vitals:  Vitals:   07/20/18 1253 07/20/18 1308  BP: 110/81 120/83  Pulse: 86 85  Resp: 20 14  Temp:  (!) 36.3 C  SpO2: 99% 98%    Last Pain:  Vitals:   07/20/18 1308  TempSrc:   PainSc: 0-No pain                 Shraddha Lebron

## 2018-07-20 NOTE — Progress Notes (Signed)
Made call to [facility] at [phone] to notify pt's daughter, Theresa Case, who is in [address], [employer], of pt admission and surgery.

## 2018-07-21 ENCOUNTER — Encounter (HOSPITAL_COMMUNITY): Payer: Self-pay | Admitting: Surgery

## 2018-07-21 LAB — CBC
HCT: 36.7 % (ref 36.0–46.0)
Hemoglobin: 12.4 g/dL (ref 12.0–15.0)
MCH: 30.8 pg (ref 26.0–34.0)
MCHC: 33.8 g/dL (ref 30.0–36.0)
MCV: 91.1 fL (ref 80.0–100.0)
Platelets: 249 10*3/uL (ref 150–400)
RBC: 4.03 MIL/uL (ref 3.87–5.11)
RDW: 13.2 % (ref 11.5–15.5)
WBC: 5.7 10*3/uL (ref 4.0–10.5)
nRBC: 0 % (ref 0.0–0.2)

## 2018-07-21 LAB — COMPREHENSIVE METABOLIC PANEL
ALT: 101 U/L — ABNORMAL HIGH (ref 0–44)
AST: 62 U/L — ABNORMAL HIGH (ref 15–41)
Albumin: 3.4 g/dL — ABNORMAL LOW (ref 3.5–5.0)
Alkaline Phosphatase: 69 U/L (ref 38–126)
Anion gap: 11 (ref 5–15)
BUN: 5 mg/dL — ABNORMAL LOW (ref 6–20)
CO2: 25 mmol/L (ref 22–32)
Calcium: 8.9 mg/dL (ref 8.9–10.3)
Chloride: 105 mmol/L (ref 98–111)
Creatinine, Ser: 0.74 mg/dL (ref 0.44–1.00)
GFR calc non Af Amer: >60 mL/min (ref >60)
Glucose, Bld: 95 mg/dL (ref 70–99)
Potassium: 3.3 mmol/L — ABNORMAL LOW (ref 3.5–5.1)
SODIUM: 141 mmol/L (ref 135–145)
Total Bilirubin: 0.8 mg/dL (ref 0.3–1.2)
Total Protein: 6.4 g/dL — ABNORMAL LOW (ref 6.5–8.1)

## 2018-07-21 MED ORDER — ONDANSETRON HCL 4 MG PO TABS: 4.0000 mg | ORAL_TABLET | Freq: Four times a day (QID) | ORAL | 0 refills | Status: DC

## 2018-07-21 MED ORDER — ACETAMINOPHEN 500 MG PO TABS: 1000.0000 mg | ORAL_TABLET | Freq: Four times a day (QID) | ORAL | 0 refills | Status: AC | PRN

## 2018-07-21 MED ORDER — OXYCODONE HCL 5 MG PO TABS: 5.0000 mg | ORAL_TABLET | Freq: Four times a day (QID) | ORAL | 0 refills | Status: DC | PRN

## 2018-07-21 MED ORDER — DOCUSATE SODIUM 100 MG PO CAPS: 100.0000 mg | ORAL_CAPSULE | Freq: Every day | ORAL | 0 refills | Status: AC | PRN

## 2018-07-21 NOTE — Discharge Instructions (Signed)
[facility] ° °Please arrive at least 30 min before your appointment to complete your check in paperwork.  If you are unable to arrive 30 min prior to your appointment time we may have to cancel or reschedule you. °LAPAROSCOPIC SURGERY: POST OP INSTRUCTIONS °Always review your discharge instruction sheet given to you by the facility where your surgery was performed. °IF YOU HAVE DISABILITY OR FAMILY LEAVE FORMS, YOU MUST BRING THEM TO THE OFFICE FOR PROCESSING.   °DO NOT GIVE THEM TO YOUR DOCTOR. ° °PAIN CONTROL ° °1. First take acetaminophen (Tylenol) AND/or ibuprofen (Advil) to control your pain after surgery.  Follow directions on package.  Taking acetaminophen (Tylenol) and/or ibuprofen (Advil) regularly after surgery will help to control your pain and lower the amount of prescription pain medication you may need.  You should not take more than 4,000 mg (4 grams) of acetaminophen (Tylenol) in 24 hours.  You should not take ibuprofen (Advil), aleve, motrin, naprosyn or other NSAIDS if you have a history of stomach ulcers or chronic kidney disease.  °2. A prescription for pain medication may be given to you upon discharge.  Take your pain medication as prescribed, if you still have uncontrolled pain after taking acetaminophen (Tylenol) or ibuprofen (Advil). °3. Use ice packs to help control pain. °4. If you need a refill on your pain medication, please contact your pharmacy.  They will contact our office to request authorization. Prescriptions will not be filled after 5pm or on week-ends. ° °HOME MEDICATIONS °5. Take your usually prescribed medications unless otherwise directed. ° °DIET °6. You should follow a light diet the first few days after arrival home.  Be sure to include lots of fluids daily. Avoid fatty, fried foods.  ° °CONSTIPATION °7. It is common to experience some constipation after surgery and if you are taking pain medication.  Increasing fluid intake and taking a stool  softener (such as Colace) will usually help or prevent this problem from occurring.  A mild laxative (Milk of Magnesia or Miralax) should be taken according to package instructions if there are no bowel movements after 48 hours. ° °WOUND/INCISION CARE °8. Most patients will experience some swelling and bruising in the area of the incisions.  Ice packs will help.  Swelling and bruising can take several days to resolve.  °9. Unless discharge instructions indicate otherwise, follow guidelines below  °a. STERI-STRIPS - you may remove your outer bandages 48 hours after surgery, and you may shower at that time.  You have steri-strips (small skin tapes) in place directly over the incision.  These strips should be left on the skin for 7-10 days.   °b. DERMABOND/SKIN GLUE - you may shower in 24 hours.  The glue will flake off over the next 2-3 weeks. °10. Any sutures or staples will be removed at the office during your follow-up visit. ° °ACTIVITIES °11. You may resume regular (light) daily activities beginning the next day--such as daily self-care, walking, climbing stairs--gradually increasing activities as tolerated.  You may have sexual intercourse when it is comfortable.  Refrain from any heavy lifting or straining until approved by your doctor. °a. You may drive when you are no longer taking prescription pain medication, you can comfortably wear a seatbelt, and you can safely maneuver your car and apply brakes. ° °FOLLOW-UP °12. You should see your doctor in the office for a follow-up appointment approximately 2-3 weeks after your surgery.  You should have been given your post-op/follow-up appointment when   your surgery was scheduled.  If you did not receive a post-op/follow-up appointment, make sure that you call for this appointment within a day or two after you arrive home to insure a convenient appointment time. ° ° °WHEN TO CALL YOUR DOCTOR: °1. Fever over 101.0 °2. Inability to urinate °3. Continued bleeding from  incision. °4. Increased pain, redness, or drainage from the incision. °5. Increasing abdominal pain ° °The clinic staff is available to answer your questions during regular business hours.  Please don’t hesitate to call and ask to speak to one of the nurses for clinical concerns.  If you have a medical emergency, go to the nearest emergency room or call 911.  A surgeon from [facility] is always on call at the hospital. °[address], [address], [place], [place]  [address] ? [address] [address], , [place]   [address] °[phone] ? [phone] ? FAX [phone] ° °

## 2018-07-21 NOTE — Progress Notes (Signed)
Attempted visit w/ pt, but she was out to surgery.  Margretta SidleAndrea M Milliana Reddoch Chaplain resident, [phone]

## 2018-07-21 NOTE — Discharge Summary (Signed)
Patient ID: Theresa Case [id number] [birth year] 58 y.o.  Admit date: 07/19/2018 Discharge date: 07/21/2018  Admitting Diagnosis: Biliary Colic  Discharge Diagnosis Patient Active Problem List   Diagnosis Date Noted  . Biliary colic 81/27/5170    Consultants None  Reason for Admission: Theresa Case is a 58yo female who presented to [facility] earlier today with worsening abdominal pain. States that she started having epigastric abdominal pain last night after dinner, and ever since the pain has gradually gotten worse. Associated with nausea and sweats. Denies emesis, diarrhea, or fevers. States that she had a similar episode about 3 weeks ago, but the pain resolved without intervention.   ED workup included u/s which showed small stones dependent in the gallbladder but no sonographic evidence of cholecystitis or obstruction, mild fatty liver. WBC 7.1, AST 251, ALT 171, alk phos 83, Tbili 0.4. Patient states that her PCP has told her that her LFTs have been slightly elevated in the past.  Procedures Laparoscopic Cholecystectomy - Dr. Georgette Dover (07/20/18)  Hospital Course:  The patient was admitted and underwent a laparoscopic cholecystectomy with IOC.  The patient tolerated the procedure well.  On POD 1, the patient was tolerating a regular diet, voiding well, mobilizing, and pain was controlled with oral pain medications.  The patient was stable for DC home at this time with appropriate follow up made.  Physical Exam: Abd: Soft, ND. Appropriately tender. +BS. Tegaderm dressings in place, no surrounding erythema or drainage.   Allergies as of 07/21/2018      Reactions   Cephalexin Shortness Of Breath   Cephalosporins Shortness Of Breath   Magnesium Shortness Of Breath   PT states increased heart rate and difficulty breathing with IV Mag only.    Magnesium Carbonate Shortness Of Breath   Rapid heart rate    Magnesium Trisilicate Shortness Of Breath   Rapid heart rate  Rapid heart  rate    Magnesium-containing Compounds Shortness Of Breath   Rapid heart rate    Strawberry Extract Anaphylaxis, Other (See Comments), Shortness Of Breath   Verapamil Shortness Of Breath, Rash   Aleve [naproxen] Other (See Comments)   Heart race    Codeine Other (See Comments)   Headache  "Very bad headache"   Naproxen Sodium Other (See Comments)   Heart race    Tape    Pulls skin off of softer skin (chest area, thigh area, ect)   Tramadol Nausea And Vomiting      Medication List    STOP taking these medications   metoprolol succinate 25 MG 24 hr tablet Commonly known as:  TOPROL-XL     TAKE these medications   acetaminophen 500 MG tablet Commonly known as:  TYLENOL Take 2 tablets (1,000 mg total) by mouth every 6 (six) hours as needed.   aspirin EC 81 MG tablet Take 1 tablet (81 mg total) by mouth daily.   docusate sodium 100 MG capsule Commonly known as:  COLACE Take 1 capsule (100 mg total) by mouth daily as needed for mild constipation.   ergocalciferol 1.25 MG (50000 UT) capsule Commonly known as:  VITAMIN D2 Take 50,000 Units by mouth once a week.   hydrochlorothiazide 25 MG tablet Commonly known as:  HYDRODIURIL Take 25 mg by mouth daily.   LINZESS 290 MCG Caps capsule Generic drug:  linaclotide Take 290 mcg by mouth daily as needed (constipation).   multivitamin with minerals tablet Take 1 tablet by mouth daily.   ondansetron 4 MG tablet Commonly known  as:  ZOFRAN Take 1 tablet (4 mg total) by mouth every 6 (six) hours.   oxyCODONE 5 MG immediate release tablet Commonly known as:  Oxy IR/ROXICODONE Take 1 tablet (5 mg total) by mouth every 6 (six) hours as needed for moderate pain, severe pain or breakthrough pain.        Follow-up Information    [facility].   Specialty:  General Surgery Why:  01/30 at 2:30 pm. Please arrive 30 minutes prior to your appointment to fill out paperwork. You will need a copy of your photo  ID and insurance card.  Contact information: [address] [place] [address] [phone]           Signed: Alferd Apa, [facility] 07/21/2018, 9:13 AM Pager: [phone]

## 2018-07-21 NOTE — Progress Notes (Signed)
Theresa Case to be D/C'd  per MD order. Discussed with the patient and all questions fully answered.  VSS, Skin clean, dry and intact without evidence of skin break down, no evidence of skin tears noted.  IV catheter discontinued intact. Site without signs and symptoms of complications. Dressing and pressure applied.  An After Visit Summary was printed and given to the patient. Patient received prescription.  D/c education completed with patient/family including follow up instructions, medication list, d/c activities limitations if indicated, with other d/c instructions as indicated by MD - patient able to verbalize understanding, all questions fully answered.   Patient instructed to return to ED, call 911, or call MD for any changes in condition.   Patient to be escorted via WC, and D/C home via private auto.

## 2018-09-05 DIAGNOSIS — B179 Acute viral hepatitis, unspecified: Secondary | ICD-10-CM

## 2018-09-05 HISTORY — DX: Acute viral hepatitis, unspecified: B17.9

## 2018-09-14 ENCOUNTER — Encounter (HOSPITAL_COMMUNITY): Payer: Self-pay | Admitting: Emergency Medicine

## 2018-09-14 ENCOUNTER — Observation Stay (HOSPITAL_COMMUNITY): Payer: Managed Care, Other (non HMO)

## 2018-09-14 ENCOUNTER — Emergency Department (HOSPITAL_COMMUNITY): Payer: Managed Care, Other (non HMO)

## 2018-09-14 ENCOUNTER — Inpatient Hospital Stay (HOSPITAL_COMMUNITY)
Admission: EM | Admit: 2018-09-14 | Discharge: 2018-09-18 | DRG: 442 | Disposition: A | Discharge status: Home / Self Care | Payer: Managed Care, Other (non HMO) | Attending: Student in an Organized Health Care Education/Training Program | Admitting: Student in an Organized Health Care Education/Training Program

## 2018-09-14 ENCOUNTER — Other Ambulatory Visit: Payer: Self-pay

## 2018-09-14 DIAGNOSIS — Z8719 Personal history of other diseases of the digestive system: Secondary | ICD-10-CM

## 2018-09-14 DIAGNOSIS — E669 Obesity, unspecified: Secondary | ICD-10-CM

## 2018-09-14 DIAGNOSIS — Z87891 Personal history of nicotine dependence: Secondary | ICD-10-CM

## 2018-09-14 DIAGNOSIS — Z91018 Allergy to other foods: Secondary | ICD-10-CM

## 2018-09-14 DIAGNOSIS — T7800XA Anaphylactic reaction due to unspecified food, initial encounter: Secondary | ICD-10-CM | POA: Diagnosis not present

## 2018-09-14 DIAGNOSIS — Z79899 Other long term (current) drug therapy: Secondary | ICD-10-CM

## 2018-09-14 DIAGNOSIS — I10 Essential (primary) hypertension: Secondary | ICD-10-CM | POA: Diagnosis present

## 2018-09-14 DIAGNOSIS — Z888 Allergy status to other drugs, medicaments and biological substances status: Secondary | ICD-10-CM

## 2018-09-14 DIAGNOSIS — Z7982 Long term (current) use of aspirin: Secondary | ICD-10-CM

## 2018-09-14 DIAGNOSIS — Z886 Allergy status to analgesic agent status: Secondary | ICD-10-CM

## 2018-09-14 DIAGNOSIS — E876 Hypokalemia: Secondary | ICD-10-CM | POA: Diagnosis present

## 2018-09-14 DIAGNOSIS — Z90710 Acquired absence of both cervix and uterus: Secondary | ICD-10-CM

## 2018-09-14 DIAGNOSIS — K5909 Other constipation: Secondary | ICD-10-CM | POA: Diagnosis present

## 2018-09-14 DIAGNOSIS — Z91048 Other nonmedicinal substance allergy status: Secondary | ICD-10-CM

## 2018-09-14 DIAGNOSIS — B179 Acute viral hepatitis, unspecified: Secondary | ICD-10-CM | POA: Diagnosis not present

## 2018-09-14 DIAGNOSIS — Z885 Allergy status to narcotic agent status: Secondary | ICD-10-CM

## 2018-09-14 DIAGNOSIS — K219 Gastro-esophageal reflux disease without esophagitis: Secondary | ICD-10-CM | POA: Diagnosis present

## 2018-09-14 DIAGNOSIS — Z8249 Family history of ischemic heart disease and other diseases of the circulatory system: Secondary | ICD-10-CM

## 2018-09-14 DIAGNOSIS — K760 Fatty (change of) liver, not elsewhere classified: Secondary | ICD-10-CM | POA: Diagnosis present

## 2018-09-14 DIAGNOSIS — R748 Abnormal levels of other serum enzymes: Secondary | ICD-10-CM

## 2018-09-14 DIAGNOSIS — R1011 Right upper quadrant pain: Secondary | ICD-10-CM

## 2018-09-14 DIAGNOSIS — G932 Benign intracranial hypertension: Secondary | ICD-10-CM | POA: Diagnosis present

## 2018-09-14 DIAGNOSIS — T7804XA Anaphylactic reaction due to fruits and vegetables, initial encounter: Secondary | ICD-10-CM | POA: Diagnosis not present

## 2018-09-14 DIAGNOSIS — Z881 Allergy status to other antibiotic agents status: Secondary | ICD-10-CM

## 2018-09-14 DIAGNOSIS — Z87442 Personal history of urinary calculi: Secondary | ICD-10-CM

## 2018-09-14 DIAGNOSIS — K5900 Constipation, unspecified: Secondary | ICD-10-CM | POA: Diagnosis present

## 2018-09-14 DIAGNOSIS — X58XXXA Exposure to other specified factors, initial encounter: Secondary | ICD-10-CM | POA: Diagnosis not present

## 2018-09-14 DIAGNOSIS — M1990 Unspecified osteoarthritis, unspecified site: Secondary | ICD-10-CM | POA: Diagnosis present

## 2018-09-14 DIAGNOSIS — Y92230 Patient room in hospital as the place of occurrence of the external cause: Secondary | ICD-10-CM | POA: Diagnosis present

## 2018-09-14 DIAGNOSIS — Z9049 Acquired absence of other specified parts of digestive tract: Secondary | ICD-10-CM

## 2018-09-14 HISTORY — DX: Acute viral hepatitis, unspecified: B17.9

## 2018-09-14 LAB — URINALYSIS, ROUTINE W REFLEX MICROSCOPIC
Bacteria, UA: NONE SEEN
Bilirubin Urine: NEGATIVE
Glucose, UA: NEGATIVE mg/dL
Hgb urine dipstick: NEGATIVE
KETONES UR: NEGATIVE mg/dL
LEUKOCYTE UA: NEGATIVE
Nitrite: NEGATIVE
PH: 6 (ref 5.0–8.0)
Protein, ur: 30 mg/dL — AB
Specific Gravity, Urine: >1.046 — ABNORMAL HIGH (ref 1.005–1.030)

## 2018-09-14 LAB — LIPASE, BLOOD: LIPASE: 21 U/L (ref 11–51)

## 2018-09-14 LAB — COMPREHENSIVE METABOLIC PANEL
ALT: 68 U/L — AB (ref 0–44)
AST: 116 U/L — ABNORMAL HIGH (ref 15–41)
Albumin: 3.6 g/dL (ref 3.5–5.0)
Alkaline Phosphatase: 84 U/L (ref 38–126)
Anion gap: 7 (ref 5–15)
BUN: 12 mg/dL (ref 6–20)
CO2: 25 mmol/L (ref 22–32)
Calcium: 9 mg/dL (ref 8.9–10.3)
Chloride: 108 mmol/L (ref 98–111)
Creatinine, Ser: 0.83 mg/dL (ref 0.44–1.00)
GFR calc Af Amer: >60 mL/min (ref >60)
GFR calc non Af Amer: >60 mL/min (ref >60)
Glucose, Bld: 138 mg/dL — ABNORMAL HIGH (ref 70–99)
Potassium: 2.9 mmol/L — ABNORMAL LOW (ref 3.5–5.1)
Sodium: 140 mmol/L (ref 135–145)
Total Bilirubin: 0.5 mg/dL (ref 0.3–1.2)
Total Protein: 6.5 g/dL (ref 6.5–8.1)

## 2018-09-14 LAB — CBC
HCT: 38.3 % (ref 36.0–46.0)
Hemoglobin: 12.6 g/dL (ref 12.0–15.0)
MCH: 30.6 pg (ref 26.0–34.0)
MCHC: 32.9 g/dL (ref 30.0–36.0)
MCV: 93 fL (ref 80.0–100.0)
Platelets: 291 10*3/uL (ref 150–400)
RBC: 4.12 MIL/uL (ref 3.87–5.11)
RDW: 13.3 % (ref 11.5–15.5)
WBC: 5 10*3/uL (ref 4.0–10.5)
nRBC: 0 % (ref 0.0–0.2)

## 2018-09-14 IMAGING — CT CT ABDOMEN AND PELVIS WITH CONTRAST
2 of 5 series · 16 of 46 positions shown, 18 images · IV contrast (Omni 300)
Comparison: [DATE]

CLINICAL DATA: Right upper quadrant pain, history of
cholecystectomy several weeks ago

EXAM:
CT ABDOMEN AND PELVIS WITH CONTRAST
TECHNIQUE: Multidetector CT imaging of the abdomen and pelvis was performed
using the standard protocol following bolus administration of
intravenous contrast.
CONTRAST:  100mL OMNIPAQUE IOHEXOL 300 MG/ML  SOLN

[Series 3: a/p w/ 5mm · axial · 0.78mm/px · z∈[+822,+1247]mm · 13 of 97 slices shown, 15 images]
[im 6/97  soft-tissue]
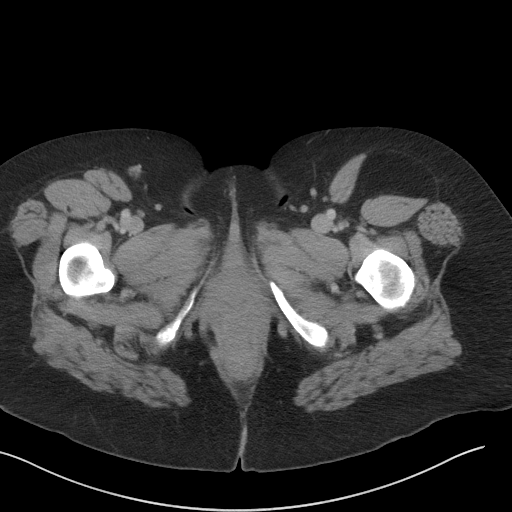
[im 6/97  bone]
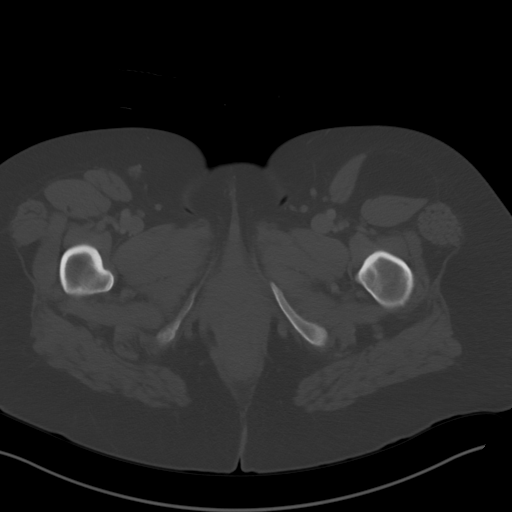
[im 16/97  soft-tissue]
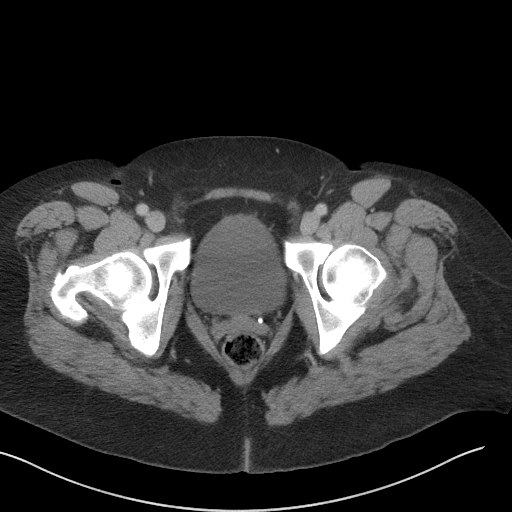
[im 21/97  soft-tissue]
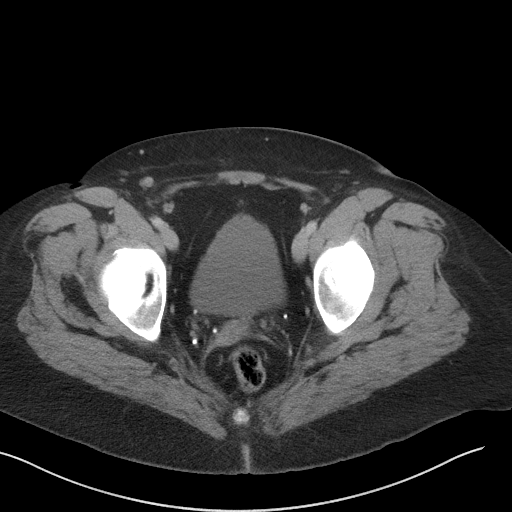
[im 26/97  soft-tissue]
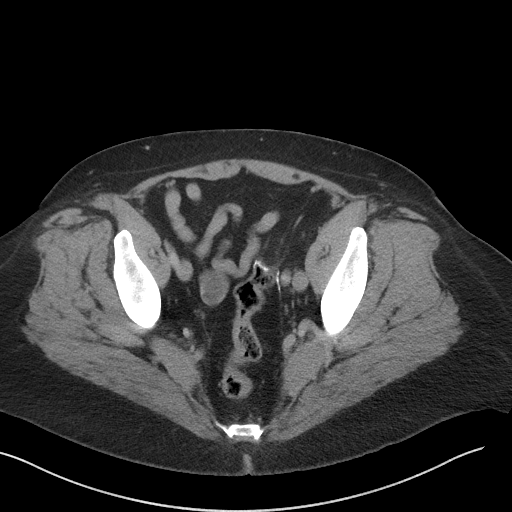
[im 36/97  soft-tissue]
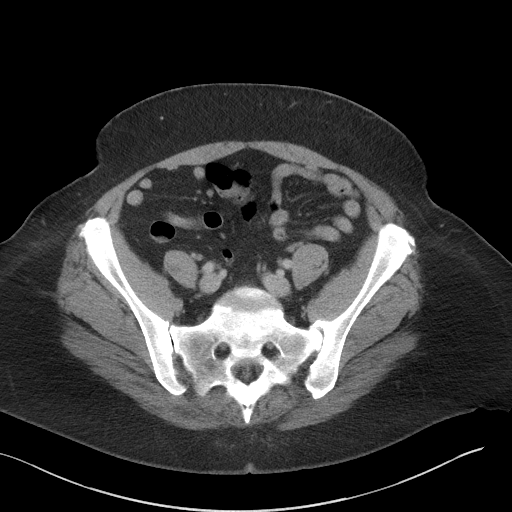
[im 41/97  soft-tissue]
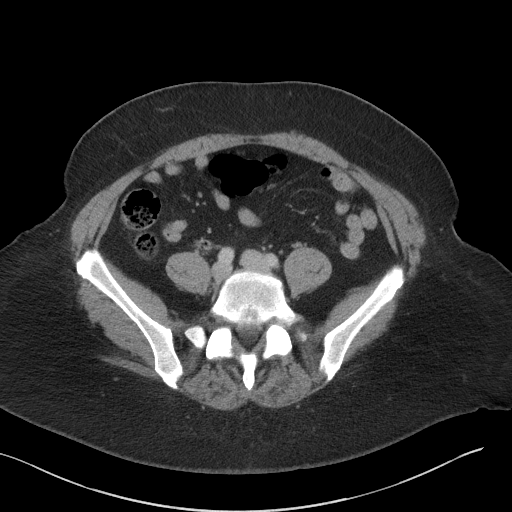
[im 51/97  soft-tissue]
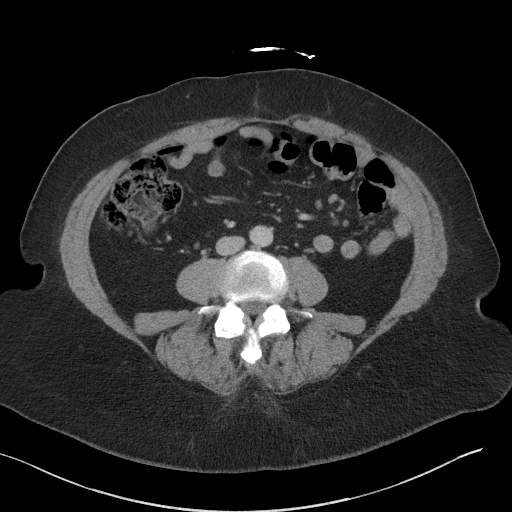
[im 56/97  soft-tissue]
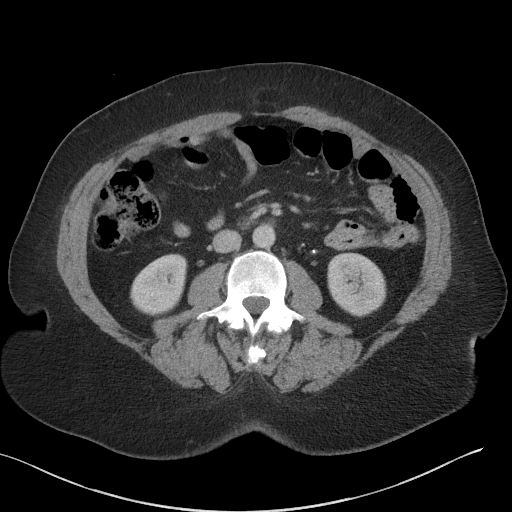
[im 61/97  soft-tissue]
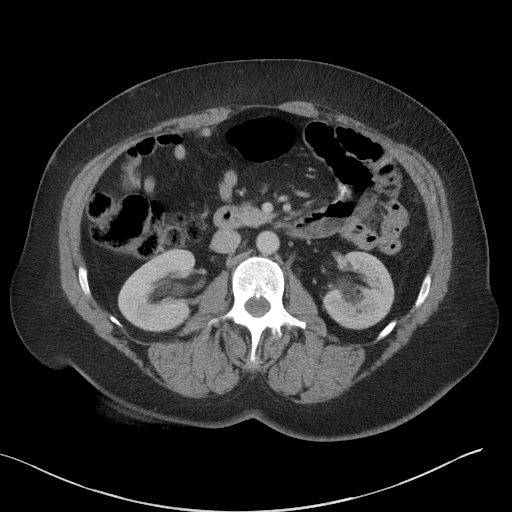
[im 61/97  bone]
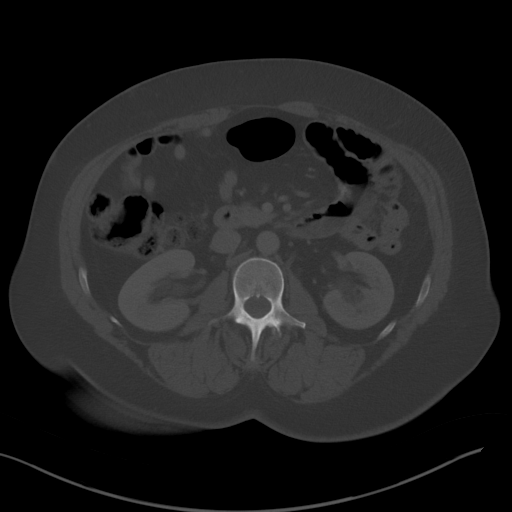
[im 71/97  soft-tissue]
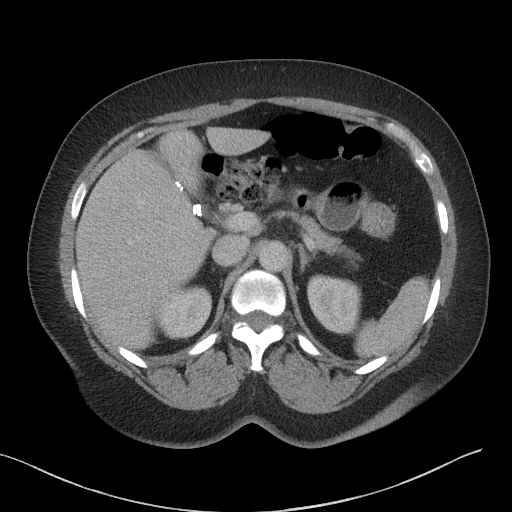
[im 76/97  soft-tissue]
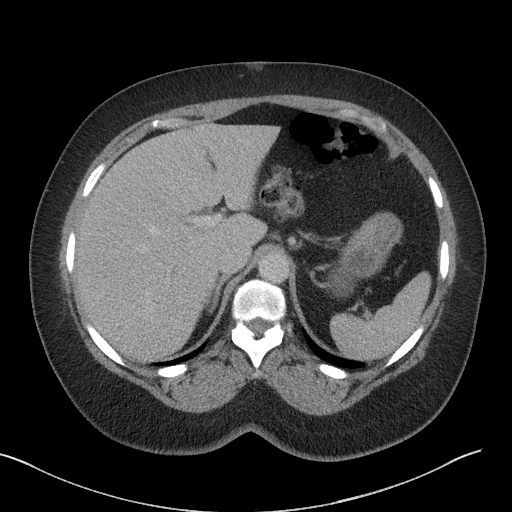
[im 81/97  soft-tissue]
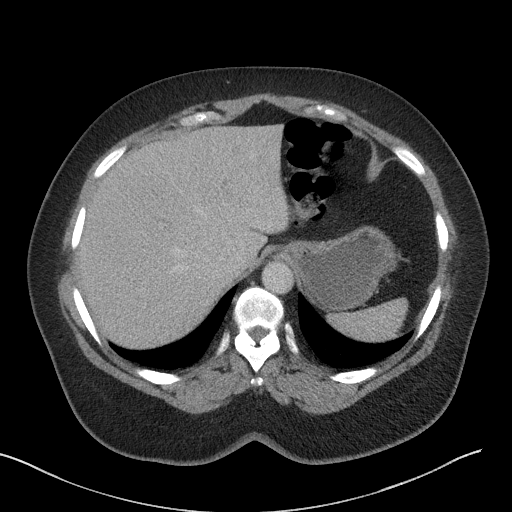
[im 91/97  soft-tissue]
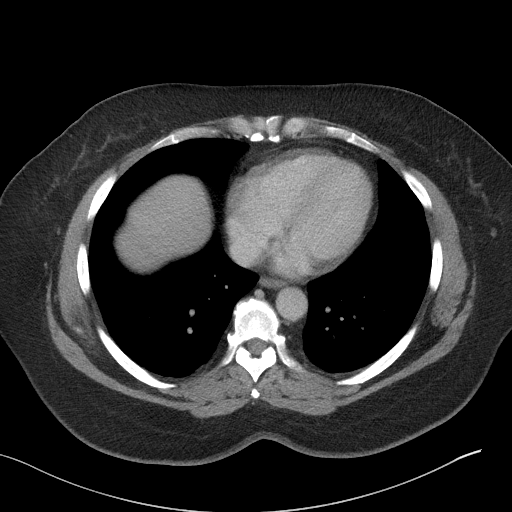

[Series 6: a/p w/ cor · coronal · 0.83mm/px · 3 of 167 slices shown]
[im 56/167  soft-tissue]
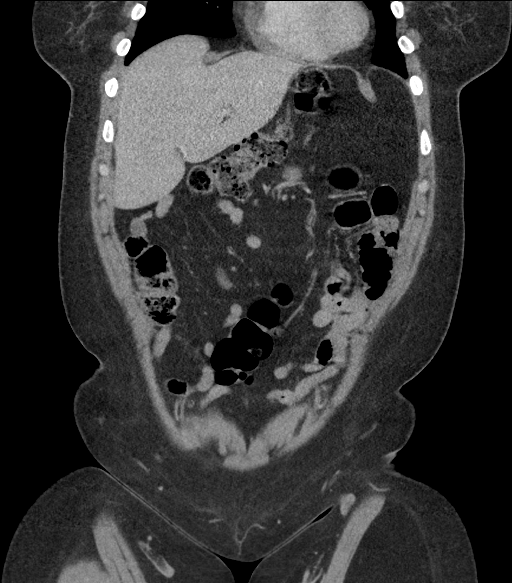
[im 74/167  soft-tissue]
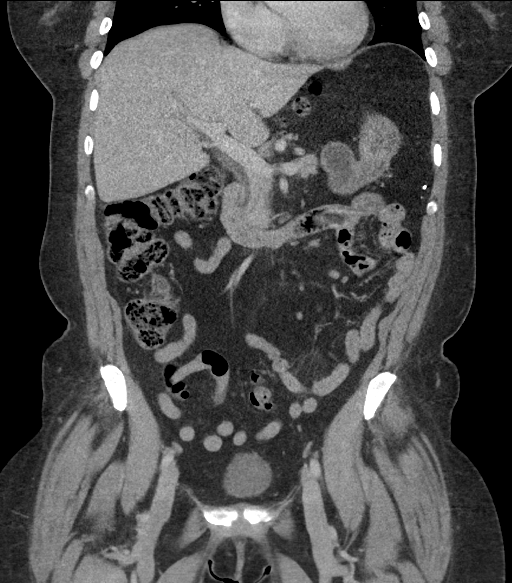
[im 93/167  soft-tissue]
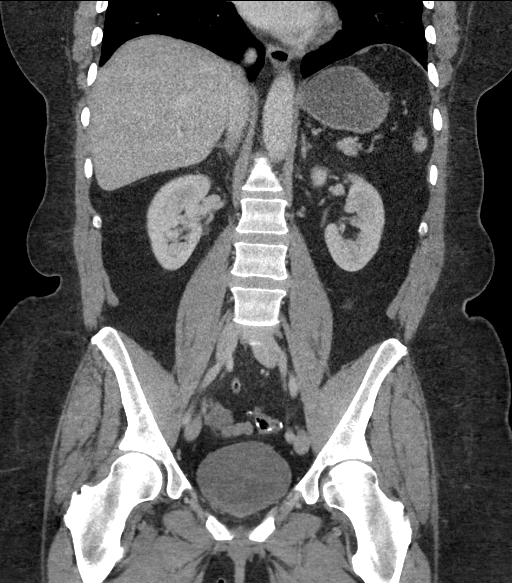

[16 of 46 positions shown; findings below may reference images not displayed]

FINDINGS: Lower chest: No acute abnormality.

Hepatobiliary: Liver is within normal limits. Changes of prior
cholecystectomy are seen. No gallbladder fossa fluid collection is
seen. No definitive ductal dilatation to suggest obstruction is
seen.

Pancreas: Unremarkable. No pancreatic ductal dilatation or
surrounding inflammatory changes.

Spleen: Normal in size without focal abnormality.

Adrenals/Urinary Tract: Adrenal glands are within normal limits
bilaterally. The kidneys show no renal calculi or obstructive
change. Bilateral parapelvic cysts are noted. The bladder is
partially distended.

Stomach/Bowel: Postsurgical changes in the sigmoid colon are noted.
No obstructive or inflammatory changes are seen. The appendix is
within normal limits. No small bowel abnormality is noted. Stomach
is decompressed.

Vascular/Lymphatic: No significant vascular findings are present. No
enlarged abdominal or pelvic lymph nodes.

Reproductive: Status post hysterectomy. No adnexal masses.

Other: No abdominal wall hernia or abnormality. No abdominopelvic
ascites.

Musculoskeletal: No acute or significant osseous findings.
IMPRESSION: Changes of prior cholecystectomy. No right upper quadrant fluid
collection is identified.

No acute abnormality seen.

## 2018-09-14 IMAGING — MR MR ABDOMEN WO/W CM MRCP
17 of 18 series · 46 of 48 positions shown · IV contrast (Gadavist)
Comparison: CT abdomen and pelvis [DATE]

CLINICAL DATA: Cholecystectomy in [REDACTED]. This morning developed
severe right upper quadrant pain. Abnormal liver function test.

EXAM:
MRI ABDOMEN WITHOUT AND WITH CONTRAST (INCLUDING MRCP)
TECHNIQUE: Multiplanar multisequence MR imaging of the abdomen was performed
both before and after the administration of intravenous contrast.
Heavily T2-weighted images of the biliary and pancreatic ducts were
obtained, and three-dimensional MRCP images were rendered by post
processing.
CONTRAST:  10 mL Gadavist

[Series 4: ax haste · axial · 6.0mm · 1.25mm/px · z∈[-189,+63]mm · 2 of 36 slices shown]
[im 1/36]
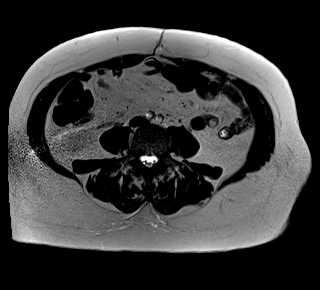
[im 36/36]
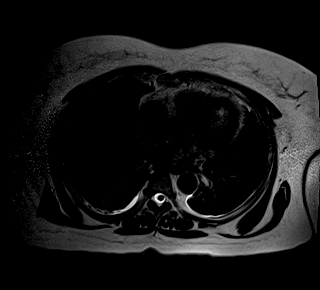

[Series 5: bSSFP · coronal · 6.0mm · 0.82mm/px · 1 of 30 slices shown]
[im 1/30]
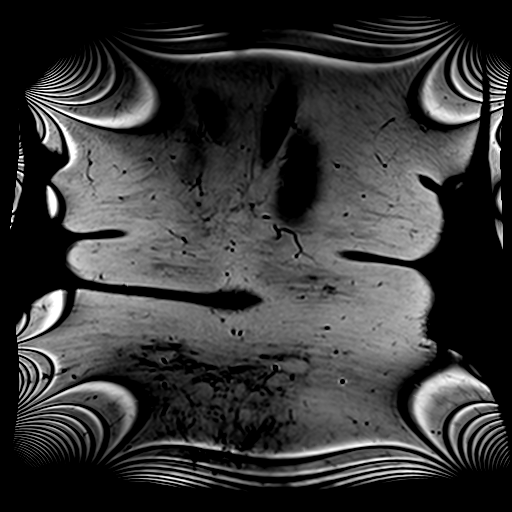

[Series 6: T2 fat-sat · axial · 6.0mm · 1.25mm/px · 1 of 36 slices shown]
[im 1/36]
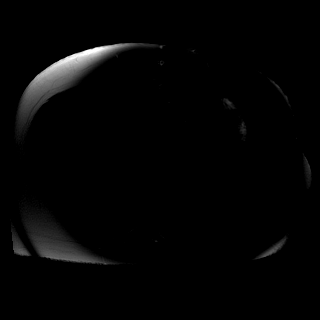

[Series 7: DWI · axial · 6.0mm · 1.42mm/px · z∈[-189,+63]mm · 4 of 108 slices shown (1 of 2)]
[im 1/108]
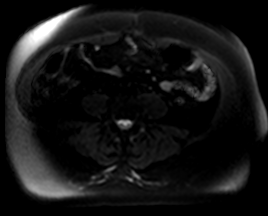
[im 36/108]
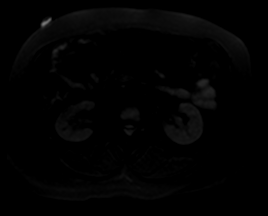
[im 72/108]
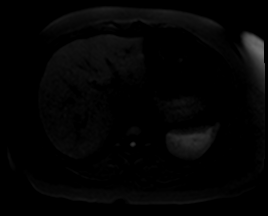
[im 108/108]
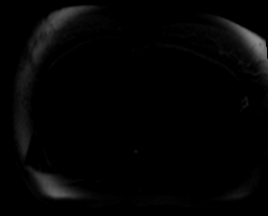

[Series 8: DWI · axial · 6.0mm · 1.42mm/px · 1 of 36 slices shown (2 of 2)]
[im 1/36]
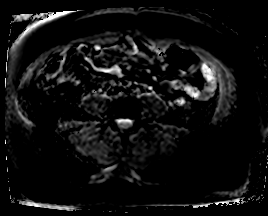

[Series 9: ax in and · axial · 3.0mm · 1.25mm/px · z∈[-197,+64]mm · 6 of 176 slices shown]
[im 1/176]
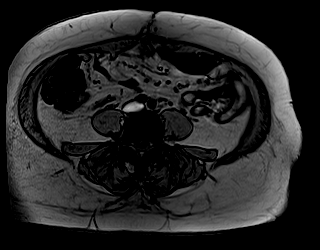
[im 36/176]
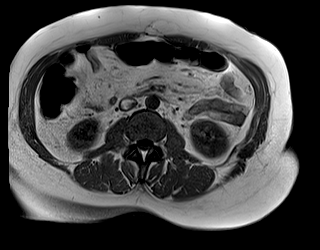
[im 71/176]
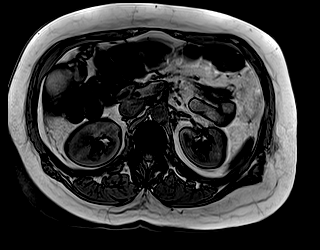
[im 106/176]
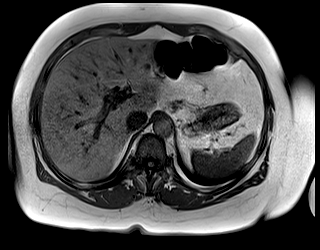
[im 141/176]
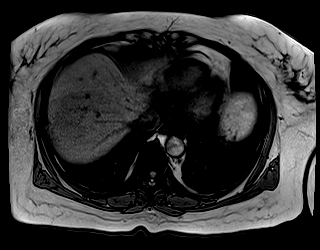
[im 176/176]
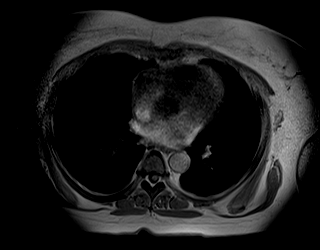

[Series 12: MRCP · coronal · 4.0mm · 1.25mm/px · 1 of 15 slices shown]
[im 1/15]
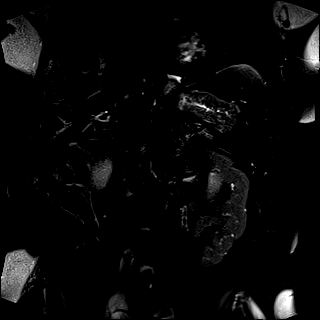

[Series 14: T1 dynamic · axial · non-contrast · 3.0mm · 1.25mm/px · z∈[-183,+78]mm · 3 of 88 slices shown (1 of 5)]
[im 1/88]
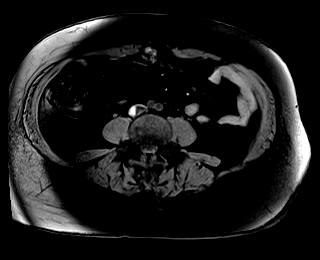
[im 44/88]
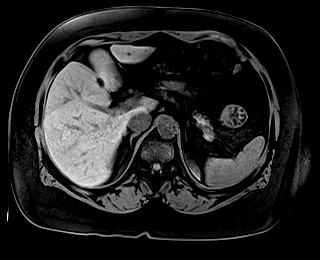
[im 88/88]
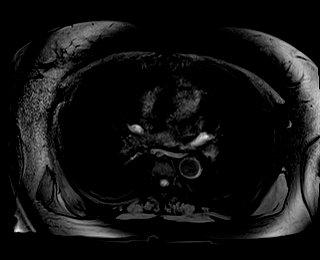

[Series 16: T1 dynamic post-contrast · axial · 3.0mm · 1.25mm/px · z∈[-183,+78]mm · 3 of 88 slices shown (1 of 5)]
[im 1/88]
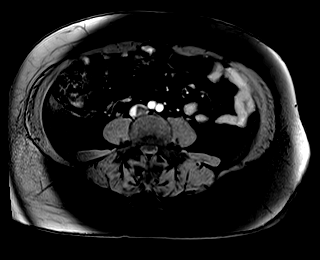
[im 44/88]
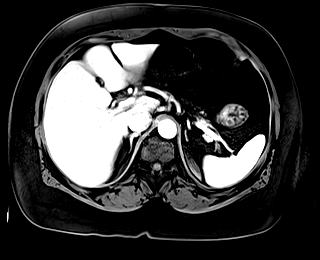
[im 88/88]
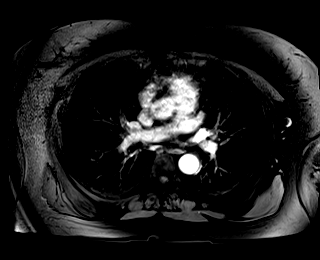

[Series 17: T1 dynamic · axial · 3.0mm · 1.25mm/px · z∈[-183,+78]mm · 3 of 88 slices shown (2 of 5)]
[im 1/88]
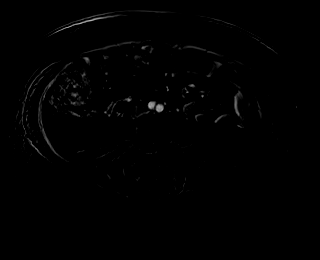
[im 44/88]
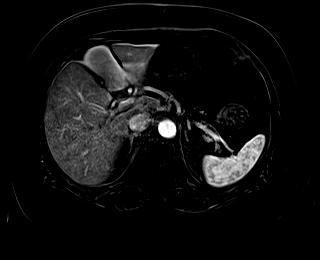
[im 88/88]
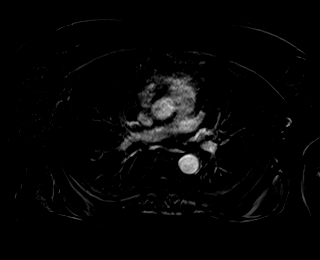

[Series 18: T1 dynamic post-contrast · axial · 3.0mm · 1.25mm/px · z∈[-183,+78]mm · 3 of 88 slices shown (2 of 5)]
[im 1/88]
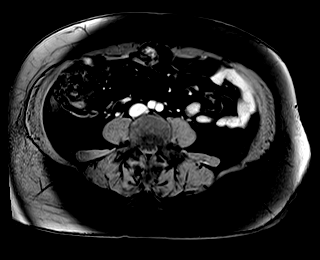
[im 44/88]
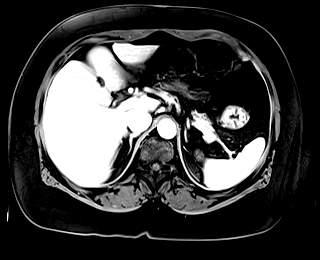
[im 88/88]
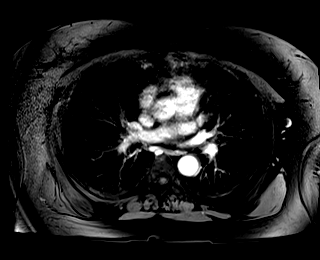

[Series 19: T1 dynamic · axial · 3.0mm · 1.25mm/px · z∈[-183,+78]mm · 3 of 88 slices shown (3 of 5)]
[im 1/88]
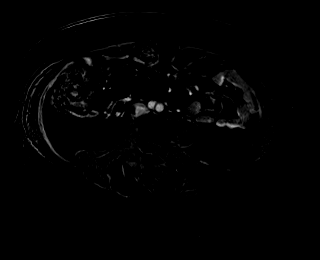
[im 44/88]
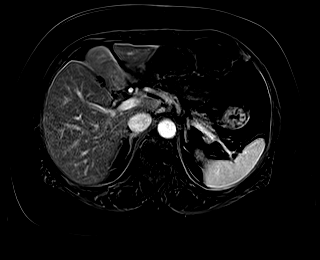
[im 88/88]
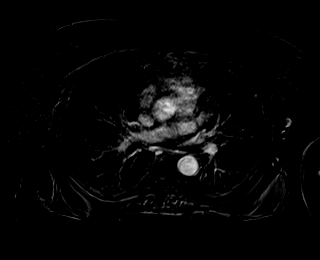

[Series 20: T1 dynamic post-contrast · axial · 3.0mm · 1.25mm/px · z∈[-183,+78]mm · 3 of 88 slices shown (3 of 5)]
[im 1/88]
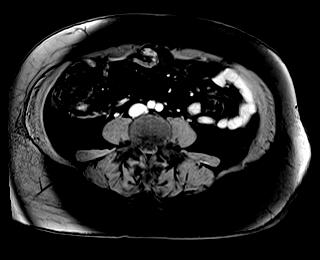
[im 44/88]
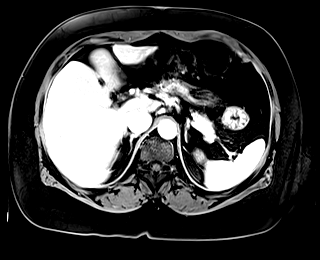
[im 88/88]
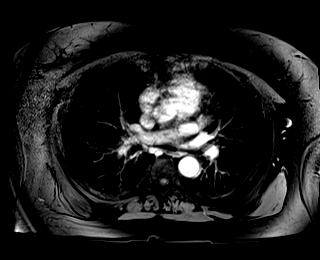

[Series 21: T1 dynamic · axial · 3.0mm · 1.25mm/px · z∈[-183,+78]mm · 3 of 88 slices shown (4 of 5)]
[im 1/88]
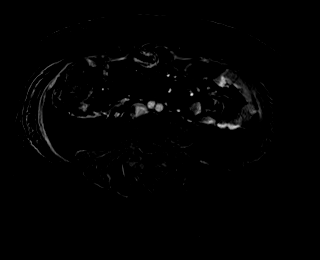
[im 44/88]
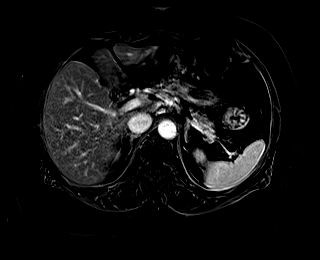
[im 88/88]
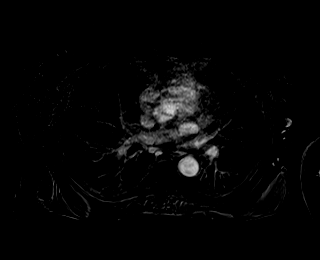

[Series 22: T1 dynamic post-contrast · axial · 3.0mm · 1.25mm/px · z∈[-183,+78]mm · 3 of 88 slices shown (4 of 5)]
[im 1/88]
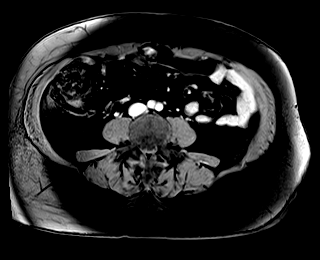
[im 44/88]
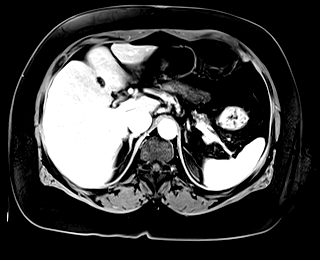
[im 88/88]
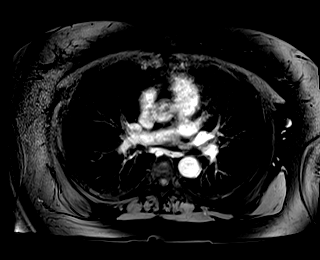

[Series 23: T1 dynamic · axial · 3.0mm · 1.25mm/px · z∈[-183,+78]mm · 3 of 88 slices shown (5 of 5)]
[im 1/88]
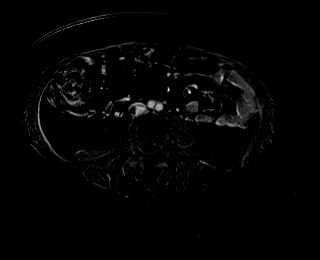
[im 44/88]
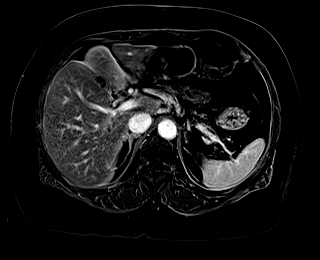
[im 88/88]
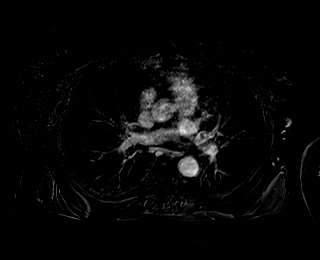

[Series 24: T1 dynamic post-contrast · coronal · 3.0mm · 1.31mm/px · 3 of 72 slices shown (5 of 5)]
[im 1/72]
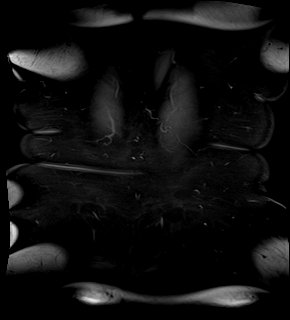
[im 36/72]
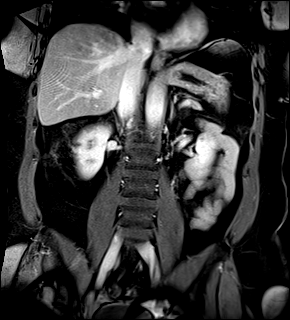
[im 72/72]
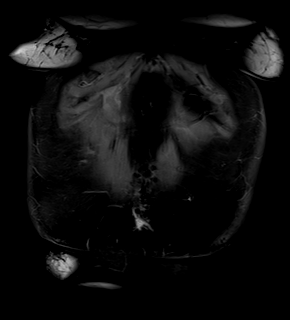

[46 of 48 positions shown; findings below may reference images not displayed]

FINDINGS: Motion artifact limits examination.

Lower chest: Mild dependent changes in the lung bases.

Hepatobiliary: Gallbladder is surgically absent. Artifact from
surgical clips demonstrated in the gallbladder fossa and anterior
abdominal wall. There is no bile duct dilatation and no bile duct
stones or filling defects are identified. No focal liver lesions are
identified.

Pancreas: No mass, inflammatory changes, or other parenchymal
abnormality identified.

Spleen:  Within normal limits in size and appearance.

Adrenals/Urinary Tract: No adrenal gland nodules. Subcentimeter
parenchymal cysts in the kidneys. Nephrograms are symmetrical and
homogeneous otherwise. No hydronephrosis.

Stomach/Bowel: Visualized portions within the abdomen are
unremarkable.

Vascular/Lymphatic: No pathologically enlarged lymph nodes
identified. No abdominal aortic aneurysm demonstrated.

Other:  No free fluid is identified in the abdomen.

Musculoskeletal: No suspicious bone lesions identified.
IMPRESSION: Surgical absence of the gallbladder. No bile duct dilatation, stone,
or filling defect. No focal liver lesions.

## 2018-09-14 MED ORDER — HYDROMORPHONE HCL 1 MG/ML IJ SOLN
0.5000 mg | INTRAMUSCULAR | Status: DC | PRN
  Administered 2018-09-14: 0.5 mg via INTRAVENOUS
  Filled 2018-09-14 (×2): qty 1

## 2018-09-14 MED ORDER — SENNOSIDES-DOCUSATE SODIUM 8.6-50 MG PO TABS
1.0000 | ORAL_TABLET | Freq: Two times a day (BID) | ORAL | Status: DC
  Administered 2018-09-14 – 2018-09-17 (×7): 1 via ORAL
  Filled 2018-09-14 (×8): qty 1

## 2018-09-14 MED ORDER — ONDANSETRON HCL 4 MG/2ML IJ SOLN
4.0000 mg | Freq: Four times a day (QID) | INTRAMUSCULAR | Status: DC | PRN
  Administered 2018-09-14 – 2018-09-15 (×3): 4 mg via INTRAVENOUS
  Filled 2018-09-14 (×3): qty 2

## 2018-09-14 MED ORDER — ONDANSETRON HCL 4 MG/2ML IJ SOLN
4.0000 mg | Freq: Once | INTRAMUSCULAR | Status: AC
  Administered 2018-09-14: 4 mg via INTRAVENOUS
  Filled 2018-09-14: qty 2

## 2018-09-14 MED ORDER — ONDANSETRON HCL 4 MG PO TABS: 4.0000 mg | ORAL_TABLET | Freq: Four times a day (QID) | ORAL | Status: DC | PRN

## 2018-09-14 MED ORDER — POTASSIUM CHLORIDE IN NACL 40-0.9 MEQ/L-% IV SOLN
INTRAVENOUS | Status: AC
  Administered 2018-09-14: 125 mL/h via INTRAVENOUS
  Filled 2018-09-14 (×2): qty 1000

## 2018-09-14 MED ORDER — SODIUM CHLORIDE 0.9% FLUSH
3.0000 mL | Freq: Once | INTRAVENOUS | Status: AC
  Administered 2018-09-14: 3 mL via INTRAVENOUS

## 2018-09-14 MED ORDER — HYDROMORPHONE HCL 1 MG/ML IJ SOLN
0.5000 mg | Freq: Once | INTRAMUSCULAR | Status: AC
  Administered 2018-09-14: 0.5 mg via INTRAVENOUS
  Filled 2018-09-14: qty 1

## 2018-09-14 MED ORDER — HYDROMORPHONE HCL 1 MG/ML IJ SOLN
1.0000 mg | Freq: Once | INTRAMUSCULAR | Status: AC
  Administered 2018-09-14: 1 mg via INTRAVENOUS
  Filled 2018-09-14: qty 1

## 2018-09-14 MED ORDER — POTASSIUM CHLORIDE 10 MEQ/100ML IV SOLN: 10.0000 meq | INTRAVENOUS | Status: AC

## 2018-09-14 MED ORDER — MORPHINE SULFATE (PF) 4 MG/ML IV SOLN
4.0000 mg | Freq: Once | INTRAVENOUS | Status: AC
  Administered 2018-09-14: 4 mg via INTRAVENOUS
  Filled 2018-09-14 (×2): qty 1

## 2018-09-14 MED ORDER — GADOBUTROL 1 MMOL/ML IV SOLN
10.0000 mL | Freq: Once | INTRAVENOUS | Status: AC | PRN
  Administered 2018-09-14: 10 mL via INTRAVENOUS

## 2018-09-14 MED ORDER — METOCLOPRAMIDE HCL 5 MG/ML IJ SOLN
10.0000 mg | Freq: Once | INTRAMUSCULAR | Status: AC
  Administered 2018-09-14: 10 mg via INTRAVENOUS
  Filled 2018-09-14: qty 2

## 2018-09-14 MED ORDER — POLYETHYLENE GLYCOL 3350 17 G PO PACK
17.0000 g | PACK | Freq: Every day | ORAL | Status: DC
  Administered 2018-09-15 – 2018-09-17 (×3): 17 g via ORAL
  Filled 2018-09-14 (×5): qty 1

## 2018-09-14 MED ORDER — IOHEXOL 300 MG/ML  SOLN
100.0000 mL | Freq: Once | INTRAMUSCULAR | Status: AC | PRN
  Administered 2018-09-14: 100 mL via INTRAVENOUS

## 2018-09-14 NOTE — Consult Note (Signed)
Reason for Consult: Questionable CBD stone Referring Physician: Hospital team  Theresa Case is an 58 y.o. female.  HPI: Patient seen and examined in hospital computer chart reviewed and her case discussed with the emergency room physician and she had a cholecystectomy in January but they were unable to do a Intra-Op cholangiogram but she did not have any symptoms until she was woken up this morning with similar gallbladder symptoms and no GI problems run in the family and she may have had an endoscopy 20 years ago but has had multiple colonoscopies and she says she is due next year and she did have part of her colon removed for constipation which did not really help and is followed in [facility] for that and she is currently feeling better but is still a little sore and has no other complaints  Past Medical History:  Diagnosis Date  . Arthritis    "left knee" (07/19/2018)  . GERD (gastroesophageal reflux disease)    hx (07/19/2018)  . Headache    "q 4 months or so; from my pseudotumor" (07/19/2018)  . Heart murmur    "grew out of it" (07/19/2018)  . History of kidney stones   . Hypertension   . Pneumonia    "once" (07/19/2018)  . PONV (postoperative nausea and vomiting)   . Pseudotumor cerebri     Past Surgical History:  Procedure Laterality Date  . ABDOMINAL HYSTERECTOMY  1999  . BAND HEMORRHOIDECTOMY  2019  . 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 CHOLECYSTECTOMY N/A 07/20/2018   Procedure: LAPAROSCOPIC CHOLECYSTECTOMY WITH INTRAOPERATIVE CHOLANGIOGRAM;  Surgeon: Manus Ruddsuei, Matthew, MD;  Location: [facility] OR;  Service: General;  Laterality: N/A;  . COLECTOMY Left ~ 2010   2.5 feet  . CYST EXCISION Left 1988   "bleeding cyst in palm of my hand" (07/19/2018)  . CYST EXCISION Left 1969   "behind my ear"  . TUBAL LIGATION  1994    Family History  Problem Relation Age of Onset  . Hypertension Mother   . Diabetes Mother   . Cancer Father   .  Hyperlipidemia Brother     Social History:  reports that she quit smoking about 30 years ago. Her smoking use included cigarettes. She has a 5.00 pack-year smoking history. She has never used smokeless tobacco. She reports previous alcohol use. She reports that she does not use drugs.  Allergies:  Allergies  Allergen Reactions  . Cephalexin Shortness Of Breath  . Cephalosporins Shortness Of Breath  . Magnesium Shortness Of Breath    PT states increased heart rate and difficulty breathing with IV Mag only.   . Magnesium Carbonate Shortness Of Breath    Rapid heart rate   . Magnesium Trisilicate Shortness Of Breath    Rapid heart rate  Rapid heart rate    . Magnesium-Containing Compounds Shortness Of Breath    Rapid heart rate   . Strawberry Extract Anaphylaxis, Other (See Comments) and Shortness Of Breath  . Verapamil Shortness Of Breath and Rash  . Aleve [Naproxen] Other (See Comments)    Heart race   . Codeine Other (See Comments)    Headache  "Very bad headache"  . Naproxen Sodium Other (See Comments)    Heart race   . Tape Other (See Comments)    Pulls skin off of softer skin (chest area, thigh area, ect)  . Tramadol Nausea And Vomiting    Medications: I have reviewed the patient's  current medications.  Results for orders placed or performed during the hospital encounter of 09/14/18 (from the past 48 hour(s))  Lipase, blood     Status: None   Collection Time: 09/14/18  3:22 AM  Result Value Ref Range   Lipase 21 11 - 51 U/L    Comment: Performed at [facility], [address] [place], [place] [address]  Comprehensive metabolic panel     Status: Abnormal   Collection Time: 09/14/18  3:22 AM  Result Value Ref Range   Sodium 140 135 - 145 mmol/L   Potassium 2.9 (L) 3.5 - 5.1 mmol/L   Chloride 108 98 - 111 mmol/L   CO2 25 22 - 32 mmol/L   Glucose, Bld 138 (H) 70 - 99 mg/dL   BUN 12 6 - 20 mg/dL   Creatinine, Ser 4.33 0.44 - 1.00 mg/dL   Calcium 9.0 8.9 -  29.5 mg/dL   Total Protein 6.5 6.5 - 8.1 g/dL   Albumin 3.6 3.5 - 5.0 g/dL   AST 188 (H) 15 - 41 U/L   ALT 68 (H) 0 - 44 U/L   Alkaline Phosphatase 84 38 - 126 U/L   Total Bilirubin 0.5 0.3 - 1.2 mg/dL   GFR calc non Af Amer >60 >60 mL/min   GFR calc Af Amer >60 >60 mL/min   Anion gap 7 5 - 15    Comment: Performed at [facility], [address], [place], [place] [address]  CBC     Status: None   Collection Time: 09/14/18  3:22 AM  Result Value Ref Range   WBC 5.0 4.0 - 10.5 K/uL   RBC 4.12 3.87 - 5.11 MIL/uL   Hemoglobin 12.6 12.0 - 15.0 g/dL   HCT 63.0 16.0 - 10.9 %   MCV 93.0 80.0 - 100.0 fL   MCH 30.6 26.0 - 34.0 pg   MCHC 32.9 30.0 - 36.0 g/dL   RDW 32.3 55.7 - 32.2 %   Platelets 291 150 - 400 K/uL   nRBC 0.0 0.0 - 0.2 %    Comment: Performed at [facility], [address], [place], [place] [address]  Urinalysis, Routine w reflex microscopic     Status: Abnormal   Collection Time: 09/14/18 10:37 AM  Result Value Ref Range   Color, Urine YELLOW YELLOW   APPearance CLEAR CLEAR   Specific Gravity, Urine >1.046 (H) 1.005 - 1.030   pH 6.0 5.0 - 8.0   Glucose, UA NEGATIVE NEGATIVE mg/dL   Hgb urine dipstick NEGATIVE NEGATIVE   Bilirubin Urine NEGATIVE NEGATIVE   Ketones, ur NEGATIVE NEGATIVE mg/dL   Protein, ur 30 (A) NEGATIVE mg/dL   Nitrite NEGATIVE NEGATIVE   Leukocytes,Ua NEGATIVE NEGATIVE   RBC / HPF 0-5 0 - 5 RBC/hpf   WBC, UA 0-5 0 - 5 WBC/hpf   Bacteria, UA NONE SEEN NONE SEEN   Squamous Epithelial / LPF 0-5 0 - 5    Comment: Performed at [facility], [address], [place], [place] [address]    Dg Abd 1 View  Result Date: 09/14/2018 CLINICAL DATA:  Constipation. Nausea and vomiting. Recent cholecystectomy. EXAM: ABDOMEN - 1 VIEW COMPARISON:  CT earlier same day. FINDINGS: Clips in the right upper quadrant consistent with previous cholecystectomy. Moderate amount of stool in the right colon. Moderate amount of gas in the transverse and left  colon. Small bowel pattern is normal. Anastomotic sutures in the sigmoid region. No abnormal calcifications  are seen. Contrast is present in the urinary bladder following previous CT. IMPRESSION: No acute or likely significant finding. Electronically Signed   By: Paulina Fusi M.D.   On: 09/14/2018 16:28   Ct Abdomen Pelvis W Contrast  Result Date: 09/14/2018 CLINICAL DATA:  Right upper quadrant pain, history of cholecystectomy several weeks ago EXAM: CT ABDOMEN AND PELVIS WITH CONTRAST TECHNIQUE: Multidetector CT imaging of the abdomen and pelvis was performed using the standard protocol following bolus administration of intravenous contrast. CONTRAST:  OMNIPAQUE IOHEXOL 300 MG/ML  SOLN COMPARISON:  07/19/2018 FINDINGS: Lower chest: No acute abnormality. Hepatobiliary: Liver is within normal limits. Changes of prior cholecystectomy are seen. No gallbladder fossa fluid collection is seen. No definitive ductal dilatation to suggest obstruction is seen. Pancreas: Unremarkable. No pancreatic ductal dilatation or surrounding inflammatory changes. Spleen: Normal in size without focal abnormality. Adrenals/Urinary Tract: Adrenal glands are within normal limits bilaterally. The kidneys show no renal calculi or obstructive change. Bilateral parapelvic cysts are noted. The bladder is partially distended. Stomach/Bowel: Postsurgical changes in the sigmoid colon are noted. No obstructive or inflammatory changes are seen. The appendix is within normal limits. No small bowel abnormality is noted. Stomach is decompressed. Vascular/Lymphatic: No significant vascular findings are present. No enlarged abdominal or pelvic lymph nodes. Reproductive: Status post hysterectomy. No adnexal masses. Other: No abdominal wall hernia or abnormality. No abdominopelvic ascites. Musculoskeletal: No acute or significant osseous findings. IMPRESSION: Changes of prior cholecystectomy. No right upper quadrant fluid collection is identified.  No acute abnormality seen. Electronically Signed   By: Alcide Clever M.D.   On: 09/14/2018 08:17   US Abdomen Limited Ruq  Result Date: 09/14/2018 CLINICAL DATA:  Right upper quadrant pain EXAM: ULTRASOUND ABDOMEN LIMITED RIGHT UPPER QUADRANT COMPARISON:  None. FINDINGS: Gallbladder: Surgically removed Common bile duct: Diameter: 5 mm Liver: Mildly increased in echogenicity without focal mass lesion. No biliary ductal dilatation is seen. Portal vein is patent on color Doppler imaging with normal direction of blood flow towards the liver. IMPRESSION: Status post cholecystectomy.  No ductal dilatation is noted. Mild increased echogenicity is noted within the liver without focal mass. This likely represents fatty infiltration. Electronically Signed   By: Alcide Clever M.D.   On: 09/14/2018 10:08    ROS negative except above Blood pressure 106/77, pulse 67, temperature (!) 97.4 F (36.3 C), temperature source Oral, resp. rate (!) 22, height 5\' 7"  (1.702 m), weight 102.5 kg, SpO2 95 %. Physical Exam vital signs stable afebrile no acute distress exam pertinent for her abdomen being mildly sore without guarding or rebound soft with occasional bowel sounds CBC okay transaminases minimally elevated KUB and abdominal CT okay  Assessment/Plan: Questionable CBD stone Plan: Await MRCP if positive she will need ERCP if negative may slowly advance her diet and we discussed the risks benefits methods and success rate of ERCP for CBD stones and please call ASAP with MRI results if positive so we can schedule her procedure ASAP  [facility] E 09/14/2018, 4:37 PM

## 2018-09-14 NOTE — ED Notes (Signed)
Patient transported to Ultrasound 

## 2018-09-14 NOTE — ED Provider Notes (Signed)
[facility] Provider Note   CSN: [id number] Arrival date & time: 09/14/18  0307    History   Chief Complaint Chief Complaint  Patient presents with  . Abdominal Pain    HPI Theresa Case is a 58 y.o. female.     The history is provided by the patient.  Abdominal Pain  Pain location:  RUQ and epigastric Pain quality: sharp, shooting, stabbing and throbbing   Pain radiates to:  Does not radiate Pain severity:  Severe Onset quality:  Sudden Duration:  5 hours Timing:  Constant Progression:  Unchanged Chronicity:  New Context comment:  7 weeks ago patient had emergent cholecystectomy and had similar pain at that time.  She had done well after surgery and has been feeling great and woke up early this morning with the severe pain. Relieved by:  None tried Worsened by:  Deep breathing, movement and palpation Ineffective treatments:  None tried Associated symptoms: anorexia, nausea and vomiting   Associated symptoms: no chest pain, no chills, no cough, no diarrhea, no dysuria, no fever, no hematuria and no shortness of breath   Risk factors: recent hospitalization   Risk factors comment:  Recent emergent cholecystectomy 7 weeks ago   Past Medical History:  Diagnosis Date  . Arthritis    "left knee" (07/19/2018)  . GERD (gastroesophageal reflux disease)    hx (07/19/2018)  . Headache    "q 4 months or so; from my pseudotumor" (07/19/2018)  . Heart murmur    "grew out of it" (07/19/2018)  . History of kidney stones   . Hypertension   . Pneumonia    "once" (07/19/2018)  . PONV (postoperative nausea and vomiting)   . Pseudotumor cerebri     Patient Active Problem List   Diagnosis Date Noted  . Biliary colic 07/19/2018    Past Surgical History:  Procedure Laterality Date  . ABDOMINAL HYSTERECTOMY  1999  . BAND HEMORRHOIDECTOMY  2019  . 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  CHOLECYSTECTOMY N/A 07/20/2018   Procedure: LAPAROSCOPIC CHOLECYSTECTOMY WITH INTRAOPERATIVE CHOLANGIOGRAM;  Surgeon: Manus Ruddsuei, Matthew, MD;  Location: [facility] OR;  Service: General;  Laterality: N/A;  . COLECTOMY Left ~ 2010   2.5 feet  . CYST EXCISION Left 1988   "bleeding cyst in palm of my hand" (07/19/2018)  . CYST EXCISION Left 1969   "behind my ear"  . TUBAL LIGATION  1994     OB History    Gravida  3   Para  3   Term      Preterm      AB      Living  3     SAB      TAB      Ectopic      Multiple      Live Births               Home Medications    Prior to Admission medications   Medication Sig Start Date End Date Taking? Authorizing Provider  acetaminophen (TYLENOL) 500 MG tablet Take 2 tablets (1,000 mg total) by mouth every 6 (six) hours as needed. 07/21/18   Maczis, Elmer SowMichael M, PA-C  aspirin EC 81 MG tablet Take 1 tablet (81 mg total) by mouth daily. 02/26/18   Pricilla Riffleoss, Paula V, MD  docusate sodium (COLACE) 100 MG capsule Take 1 capsule (100 mg total) by mouth daily as needed for mild constipation. 07/21/18  07/21/19  Maczis, Elmer Sow, PA-C  ergocalciferol (VITAMIN D2) 50000 units capsule Take 50,000 Units by mouth once a week.    [provider]  hydrochlorothiazide (HYDRODIURIL) 25 MG tablet Take 25 mg by mouth daily.    [provider]  linaclotide (LINZESS) 290 MCG CAPS capsule Take 290 mcg by mouth daily as needed (constipation).     [provider]  Multiple Vitamins-Minerals (MULTIVITAMIN WITH MINERALS) tablet Take 1 tablet by mouth daily.    [provider]  ondansetron (ZOFRAN) 4 MG tablet Take 1 tablet (4 mg total) by mouth every 6 (six) hours. 07/21/18   Maczis, Elmer Sow, PA-C  oxyCODONE (OXY IR/ROXICODONE) 5 MG immediate release tablet Take 1 tablet (5 mg total) by mouth every 6 (six) hours as needed for moderate pain, severe pain or breakthrough pain. 07/21/18   Maczis, Elmer Sow, PA-C    Family History Family History    Problem Relation Age of Onset  . Hypertension Mother   . Diabetes Mother   . Cancer Father   . Hyperlipidemia Brother     Social History Social History   Tobacco Use  . Smoking status: Former Smoker    Packs/day: 0.50    Years: 10.00    Pack years: 5.00    Types: Cigarettes    Last attempt to quit: 1990    Years since quitting: 30.2  . Smokeless tobacco: Never Used  Substance Use Topics  . Alcohol use: Not Currently  . Drug use: Never     Allergies   Cephalexin; Cephalosporins; Magnesium; Magnesium carbonate; Magnesium trisilicate; Magnesium-containing compounds; Strawberry extract; Verapamil; Aleve [naproxen]; Codeine; Naproxen sodium; Tape; and Tramadol   Review of Systems Review of Systems  Constitutional: Negative for chills and fever.  Respiratory: Negative for cough and shortness of breath.   Cardiovascular: Negative for chest pain.  Gastrointestinal: Positive for abdominal pain, anorexia, nausea and vomiting. Negative for diarrhea.  Genitourinary: Negative for dysuria and hematuria.  All other systems reviewed and are negative.    Physical Exam Updated Vital Signs BP (!) 134/91 (BP Location: Right Arm)   Pulse (!) 115   Temp (!) 97.4 F (36.3 C) (Oral)   Resp (!) 24   Ht 5\' 7"  (1.702 m)   Wt 102.5 kg   LMP  (Exact Date)   SpO2 100%   BMI 35.40 kg/m   Physical Exam Vitals signs and nursing note reviewed.  Constitutional:      General: She is in acute distress.     Appearance: She is well-developed.     Comments: Appears in pain  HENT:     Head: Normocephalic and atraumatic.  Eyes:     Pupils: Pupils are equal, round, and reactive to light.  Cardiovascular:     Rate and Rhythm: Regular rhythm. Tachycardia present.     Heart sounds: Normal heart sounds. No murmur. No friction rub.  Pulmonary:     Effort: Pulmonary effort is normal.     Breath sounds: Normal breath sounds. No wheezing or rales.  Abdominal:     General: Bowel sounds are  normal. There is no distension.     Palpations: Abdomen is soft.     Tenderness: There is abdominal tenderness in the right upper quadrant and epigastric area. There is guarding. There is no right CVA tenderness, left CVA tenderness or rebound.     Hernia: No hernia is present.  Musculoskeletal: Normal range of motion.        General: No  tenderness.     Comments: No edema  Skin:    General: Skin is warm and dry.     Capillary Refill: Capillary refill takes 2 to 3 seconds.     Findings: No rash.  Neurological:     Mental Status: She is alert and oriented to person, place, and time.     Cranial Nerves: No cranial nerve deficit.  Psychiatric:        Behavior: Behavior normal.      ED Treatments / Results  Labs (all labs ordered are listed, but only abnormal results are displayed) Labs Reviewed  COMPREHENSIVE METABOLIC PANEL - Abnormal; Notable for the following components:      Result Value   Potassium 2.9 (*)    Glucose, Bld 138 (*)    AST 116 (*)    ALT 68 (*)    All other components within normal limits  URINALYSIS, ROUTINE W REFLEX MICROSCOPIC - Abnormal; Notable for the following components:   Specific Gravity, Urine >1.046 (*)    Protein, ur 30 (*)    All other components within normal limits  LIPASE, BLOOD  CBC    EKG None  Radiology Ct Abdomen Pelvis W Contrast  Result Date: 09/14/2018 CLINICAL DATA:  Right upper quadrant pain, history of cholecystectomy several weeks ago EXAM: CT ABDOMEN AND PELVIS WITH CONTRAST TECHNIQUE: Multidetector CT imaging of the abdomen and pelvis was performed using the standard protocol following bolus administration of intravenous contrast. CONTRAST:  OMNIPAQUE IOHEXOL 300 MG/ML  SOLN COMPARISON:  07/19/2018 FINDINGS: Lower chest: No acute abnormality. Hepatobiliary: Liver is within normal limits. Changes of prior cholecystectomy are seen. No gallbladder fossa fluid collection is seen. No definitive ductal dilatation to suggest  obstruction is seen. Pancreas: Unremarkable. No pancreatic ductal dilatation or surrounding inflammatory changes. Spleen: Normal in size without focal abnormality. Adrenals/Urinary Tract: Adrenal glands are within normal limits bilaterally. The kidneys show no renal calculi or obstructive change. Bilateral parapelvic cysts are noted. The bladder is partially distended. Stomach/Bowel: Postsurgical changes in the sigmoid colon are noted. No obstructive or inflammatory changes are seen. The appendix is within normal limits. No small bowel abnormality is noted. Stomach is decompressed. Vascular/Lymphatic: No significant vascular findings are present. No enlarged abdominal or pelvic lymph nodes. Reproductive: Status post hysterectomy. No adnexal masses. Other: No abdominal wall hernia or abnormality. No abdominopelvic ascites. Musculoskeletal: No acute or significant osseous findings. IMPRESSION: Changes of prior cholecystectomy. No right upper quadrant fluid collection is identified. No acute abnormality seen. Electronically Signed   By: Alcide Clever M.D.   On: 09/14/2018 08:17   US Abdomen Limited Ruq  Result Date: 09/14/2018 CLINICAL DATA:  Right upper quadrant pain EXAM: ULTRASOUND ABDOMEN LIMITED RIGHT UPPER QUADRANT COMPARISON:  None. FINDINGS: Gallbladder: Surgically removed Common bile duct: Diameter: 5 mm Liver: Mildly increased in echogenicity without focal mass lesion. No biliary ductal dilatation is seen. Portal vein is patent on color Doppler imaging with normal direction of blood flow towards the liver. IMPRESSION: Status post cholecystectomy.  No ductal dilatation is noted. Mild increased echogenicity is noted within the liver without focal mass. This likely represents fatty infiltration. Electronically Signed   By: Alcide Clever M.D.   On: 09/14/2018 10:08    Procedures Procedures (including critical care time)  Medications Ordered in ED Medications  sodium chloride flush (NS) 0.9 % injection 3  mL (3 mLs Intravenous Given 09/14/18 0751)  morphine 4 MG/ML injection 4 mg (4 mg Intravenous Given 09/14/18 0751)  ondansetron (  ZOFRAN) injection 4 mg (4 mg Intravenous Given 09/14/18 0751)  iohexol (OMNIPAQUE) 300 MG/ML solution 100 mL (100 mLs Intravenous Contrast Given 09/14/18 0800)     Initial Impression / Assessment and Plan / ED Course  I have reviewed the triage vital signs and the nursing notes.  Pertinent labs & imaging results that were available during my care of the patient were reviewed by me and considered in my medical decision making (see chart for details).       58 year old female with sudden onset of severe abdominal pain in the right upper quadrant that woke her from sleep.  Patient recently had cholecystectomy 7 weeks ago and had been doing great until this morning.  The pain is constant and not improving.  She denies eating anything unusual last night and has been following a very close diet since her surgery and is actually lost 15 pounds.  Patient's LFTs are elevated today at 116 and 68 as well as some mild hypokalemia of 2.9.  Lipase and white blood cell count are within normal limits.  CT is negative for fluid collection in the right upper quadrant and no other acute abnormalities are seen.  Will do ultrasound to ensure no ductal dilation.  She was given pain and nausea control. 9:14 AM Pt still in significant pain and given 2nd dose of pain meds.  11:05 AM Spoke with Dr. Ewing Schlein with GI and he states if pain remains pain free she can have outpt MRCP for further eval.  However if not she can be admitted for the test.  Patient as an outpatient follows with [facility] gastroenterology.  And if she is able to tolerate p.o.'s then will call them to have the test set up an outpatient follow-up. 12:30 PM Patient had some ginger ale and crackers and her symptoms returned.  She started having significant nausea, sweating and the pain started to recur.  Will admit for further  evaluation and GI consultation.  Final Clinical Impressions(s) / ED Diagnoses   Final diagnoses:  RUQ abdominal pain  Right upper quadrant abdominal pain    ED Discharge Orders    None       Gwyneth Sprout, MD 09/14/18 1231

## 2018-09-14 NOTE — ED Triage Notes (Signed)
Pt reports she is having upper right abd pains. Pt reports the same thing happened 7 weeks ago and she had emergency gall bladder surgery.

## 2018-09-14 NOTE — H&P (Addendum)
Date: 09/14/2018               Patient Name:  Theresa Case MRN: [id number]  DOB: [birth year] Age / Sex: 58 y.o., female   PCP: Darryl Lent, PA-C         Medical Service: Internal Medicine Teaching Service         Attending Physician: Dr. Oswaldo Done, Marquita Palms, *    First Contact: Cephas Darby Pager: [phone]  Second Contact: Dr. Reymundo Poll Pager: [phone]       After Hours (After 5p/  First Contact Pager: [phone]  weekends / holidays): Second Contact Pager: [phone]   Chief Complaint: abdominal pain  History of Present Illness: Theresa Case presented to the ED with 5 hours of acute onset abdominal pain. She woke up to the pain at 2am. The pain runs from her stomach to the right side of her abdomen, beneath the rib cage. The pain feels very similar to the pain she had before her cholecystectomy 7 weeks ago. She had associated sweating, nausea and vomiting, and has been unable to tolerate any solids or liquids today. The pain comes and goes, and is worse when she tries to eat. There was no blood or brown substance in her vomit. Her stomach feels bloated. Her last bowel movement was yesterday. There was no blood in the stool. She denies fever, chills, diarrhea, headache or urinary symptoms.  Theresa Case had a cholecystectomy 7 weeks ago after being diagnosed with cholecystitis. She has had gradually decreasing abdominal pain for the last 6 weeks, which was unrelated to eating or drinking. Her pain completely resolved last week. She had no episodes of pain until acutely this morning. Of note, Theresa Case also has an extensive history of constipation, for which she required partial colectomy in the past. She currently has a bowel movement once every two days with the help of laxatives.   Meds:  Current Meds  Medication Sig  . acetaminophen (TYLENOL) 500 MG tablet Take 2 tablets (1,000 mg total) by mouth every 6 (six) hours as needed. (Patient taking differently: Take 1,000 mg by  mouth every 6 (six) hours as needed for mild pain or fever. )  . aspirin EC 81 MG tablet Take 1 tablet (81 mg total) by mouth daily.  Marland Kitchen docusate sodium (COLACE) 100 MG capsule Take 1 capsule (100 mg total) by mouth daily as needed for mild constipation.  . ergocalciferol (VITAMIN D2) 50000 units capsule Take 50,000 Units by mouth once a week.  . hydrochlorothiazide (HYDRODIURIL) 25 MG tablet Take 25 mg by mouth daily.  . Multiple Vitamins-Minerals (MULTIVITAMIN WITH MINERALS) tablet Take 1 tablet by mouth daily.    Allergies: Allergies as of 09/14/2018 - Review Complete 09/14/2018  Allergen Reaction Noted  . Cephalexin Shortness Of Breath 01/09/2015  . Cephalosporins Shortness Of Breath 10/22/2013  . Magnesium Shortness Of Breath 03/16/2015  . Magnesium carbonate Shortness Of Breath 10/22/2013  . Magnesium trisilicate Shortness Of Breath 04/25/2015  . Magnesium-containing compounds Shortness Of Breath 10/22/2013  . Strawberry extract Anaphylaxis, Other (See Comments), and Shortness Of Breath 10/22/2013  . Verapamil Shortness Of Breath and Rash 10/22/2013  . Aleve [naproxen] Other (See Comments) 10/22/2013  . Codeine Other (See Comments) 10/22/2013  . Naproxen sodium Other (See Comments) 04/25/2015  . Tape Other (See Comments) 02/21/2016  . Tramadol Nausea And Vomiting 01/09/2015   Past Medical History:  Diagnosis Date  . Arthritis    "left knee" (07/19/2018)  . GERD (gastroesophageal  reflux disease)    hx (07/19/2018)  . Headache    "q 4 months or so; from my pseudotumor" (07/19/2018)  . Heart murmur    "grew out of it" (07/19/2018)  . History of kidney stones   . Hypertension   . Pneumonia    "once" (07/19/2018)  . PONV (postoperative nausea and vomiting)   . Pseudotumor cerebri    Surgical History: Partial colectomy for chronic constipation, salpingectomy, hysterectomy, cholecystectomy  Family History: Mother has diabetes and has had two heart attacks. Father has unspecified  leukemia.  Social History: Theresa Case moved to [place] 6 years ago to be closer to her mother. 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 She has three adult children. She smoked a few cigarettes as a teenager. She drinks 1 alcoholic beverage a month. Her last drink was 3-4 months ago. No illicit drug use. No pets  Review of Systems: A complete ROS was negative except as per HPI.   Physical Exam: Blood pressure 106/77, pulse 67, temperature (!) 97.4 F (36.3 C), temperature source Oral, resp. rate (!) 22, height 5\' 7"  (1.702 m), weight 102.5 kg, SpO2 95 %. General: moderately distressed female curled up in bed Abdominal: right upper quadrant and epigastric tenderness, voluntary guarding, no rebound or peritoneal signs. Right flank tenderness Cardiovascular: regular rate and rhythm, no murmurs, heaves, or thrills. Capillary refill < 2 seconds. no lower extremity edema, 2+ posterior tibial pulses bilaterally Respiratory: lungs are cta bilaterally  Integumentary: warm and moist Psychiatric: anxious affect Neurologic: alert and oriented x3. No focal neurologic deficits.   CMP Latest Ref Rng & Units 09/14/2018 07/21/2018 07/20/2018  Glucose 70 - 99 mg/dL 696(E) 95 952(W)  BUN 6 - 20 mg/dL 12 5(L) 7  Creatinine 4.13 - 1.00 mg/dL 2.44 0.10 2.72  Sodium 135 - 145 mmol/L 140 141 138  Potassium 3.5 - 5.1 mmol/L 2.9(L) 3.3(L) 3.3(L)  Chloride 98 - 111 mmol/L 108 105 105  CO2 22 - 32 mmol/L 25 25 25   Calcium 8.9 - 10.3 mg/dL 9.0 8.9 8.9  Total Protein 6.5 - 8.1 g/dL 6.5 5.3(G) 6.3(L)  Total Bilirubin 0.3 - 1.2 mg/dL 0.5 0.8 0.5  Alkaline Phos 38 - 126 U/L 84 69 69  AST 15 - 41 U/L 116(H) 62(H) 76(H)  ALT 0 - 44 U/L 68(H) 101(H) 108(H)   CBC Latest Ref Rng & Units 09/14/2018 07/21/2018 07/20/2018  WBC 4.0 - 10.5 K/uL 5.0 5.7 4.4  Hemoglobin 12.0 - 15.0 g/dL 64.4 03.4 74.2  Hematocrit 36.0 - 46.0 % 38.3 36.7 36.0  Platelets 150 - 400 K/uL 291 249 286   Urinalysis    Component Value  Date/Time   COLORURINE YELLOW 09/14/2018 1037   APPEARANCEUR CLEAR 09/14/2018 1037   LABSPEC >1.046 (H) 09/14/2018 1037   PHURINE 6.0 09/14/2018 1037   GLUCOSEU NEGATIVE 09/14/2018 1037   HGBUR NEGATIVE 09/14/2018 1037   BILIRUBINUR NEGATIVE 09/14/2018 1037   KETONESUR NEGATIVE 09/14/2018 1037   PROTEINUR 30 (A) 09/14/2018 1037   NITRITE NEGATIVE 09/14/2018 1037   LEUKOCYTESUR NEGATIVE 09/14/2018 1037   Lipase was 21 on 3/10  Ct Abdomen Pelvis W Contrast  Result Date: 09/14/2018 CLINICAL DATA:  Right upper quadrant pain, history of cholecystectomy several weeks ago EXAM: CT ABDOMEN AND PELVIS WITH CONTRAST TECHNIQUE: Multidetector CT imaging of the abdomen and pelvis was performed using the standard protocol following bolus administration of intravenous contrast. CONTRAST:  OMNIPAQUE IOHEXOL 300 MG/ML  SOLN COMPARISON:  07/19/2018 FINDINGS: Lower  chest: No acute abnormality. Hepatobiliary: Liver is within normal limits. Changes of prior cholecystectomy are seen. No gallbladder fossa fluid collection is seen. No definitive ductal dilatation to suggest obstruction is seen. Pancreas: Unremarkable. No pancreatic ductal dilatation or surrounding inflammatory changes. Spleen: Normal in size without focal abnormality. Adrenals/Urinary Tract: Adrenal glands are within normal limits bilaterally. The kidneys show no renal calculi or obstructive change. Bilateral parapelvic cysts are noted. The bladder is partially distended. Stomach/Bowel: Postsurgical changes in the sigmoid colon are noted. No obstructive or inflammatory changes are seen. The appendix is within normal limits. No small bowel abnormality is noted. Stomach is decompressed. Vascular/Lymphatic: No significant vascular findings are present. No enlarged abdominal or pelvic lymph nodes. Reproductive: Status post hysterectomy. No adnexal masses. Other: No abdominal wall hernia or abnormality. No abdominopelvic ascites. Musculoskeletal: No acute  or significant osseous findings. IMPRESSION: Changes of prior cholecystectomy. No right upper quadrant fluid collection is identified. No acute abnormality seen. Electronically Signed   By: Alcide Clever M.D.   On: 09/14/2018 08:17   US Abdomen Limited Ruq  Result Date: 09/14/2018 CLINICAL DATA:  Right upper quadrant pain EXAM: ULTRASOUND ABDOMEN LIMITED RIGHT UPPER QUADRANT COMPARISON:  None. FINDINGS: Gallbladder: Surgically removed Common bile duct: Diameter: 5 mm Liver: Mildly increased in echogenicity without focal mass lesion. No biliary ductal dilatation is seen. Portal vein is patent on color Doppler imaging with normal direction of blood flow towards the liver. IMPRESSION: Status post cholecystectomy.  No ductal dilatation is noted. Mild increased echogenicity is noted within the liver without focal mass. This likely represents fatty infiltration. Electronically Signed   By: Alcide Clever M.D.   On: 09/14/2018 10:08    Assessment & Plan by Problem: Active Problems:   Abdominal pain   Essential hypertension   Pseudotumor cerebri   Constipation  Theresa Case is a 58 y.o f w/ a pmh of cholecystitis s/p cholecystectomy 7 weeks ago, chronic constipation, and pseudotumor cerebri who presents on 3/10 with 1 day of epigastric & RUQ abdominal pain. Pt afebrile, lipase normal, CT scan unremarkable.   Abdominal pain: Highest on the differential is a retained gallstone post cholecystectomy. Intraoperative cholangiogram was unable to be performed. Lipase is normal but LFTs are persstently elevated from last hospitalization.  -MRCP ordered -pain is currently under control. 0.5 mg dilaudid prn q4hrs  -nausea management w/ ondansetron prn -maintenance IVF 125 mL/hr NS w/ KCl 40 mEq. Can d/c once patient tolerates oral intake -npo pending mrcp results   Hypokalemia likely 2/2 vomiting -maintenance IVF w/ KCl -40 mEq IV KCl 125 cc/hr x 12 hours (60 mEq total) - IV potassium 10 mEq x 4 -cardiac  monitoring  Chronic Constipation: s/p partial colectomy, requires regular laxatives. KUB with moderate stool burden.  -schedule miralax and sennacot  FEN: NScc/hr, repleting potassium, NPO VTE ppx: SCDs Code Status: FULL   Patient code status: Full Code Dispo: Admit patient to Observation with expected length of stay less than 2 midnights.  Signed: Helayne Seminole, Medical Student 09/14/2018, 4:16 PM  Pager: [phone]  Attestation for Student Documentation:  I personally was present and performed or re-performed the history, physical exam and medical decision-making activities of this service and have verified that the service and findings are accurately documented in the student's note.  Reymundo Poll, MD 09/14/2018, 5:00 PM

## 2018-09-14 NOTE — ED Notes (Signed)
ED TO INPATIENT HANDOFF REPORT  ED Nurse Name and Phone #: Leatha Gilding [phone]  S Name/Age/Gender Theresa Case 58 y.o. female Room/Bed: 041C/041C  Code Status   Code Status: Prior  Home/SNF/Other Home Patient oriented to: self, place, time and situation Is this baseline? Yes   Triage Complete: Triage complete  Chief Complaint side pain   Triage Note Pt reports she is having upper right abd pains. Pt reports the same thing happened 7 weeks ago and she had emergency gall bladder surgery.    Allergies Allergies  Allergen Reactions  . Cephalexin Shortness Of Breath  . Cephalosporins Shortness Of Breath  . Magnesium Shortness Of Breath    PT states increased heart rate and difficulty breathing with IV Mag only.   . Magnesium Carbonate Shortness Of Breath    Rapid heart rate   . Magnesium Trisilicate Shortness Of Breath    Rapid heart rate  Rapid heart rate    . Magnesium-Containing Compounds Shortness Of Breath    Rapid heart rate   . Strawberry Extract Anaphylaxis, Other (See Comments) and Shortness Of Breath  . Verapamil Shortness Of Breath and Rash  . Aleve [Naproxen] Other (See Comments)    Heart race   . Codeine Other (See Comments)    Headache  "Very bad headache"  . Naproxen Sodium Other (See Comments)    Heart race   . Tape Other (See Comments)    Pulls skin off of softer skin (chest area, thigh area, ect)  . Tramadol Nausea And Vomiting    Level of Care/Admitting Diagnosis ED Disposition    ED Disposition Condition Comment   Admit  Hospital Area: [facility] [[id number]]  Level of Care: Med-Surg [16]  Diagnosis: Abdominal pain [762831]  Admitting Physician: Tyson Alias [[id number]]  Attending Physician: Tyson Alias [phone]  PT Class (Do Not Modify): Observation [104]  PT Acc Code (Do Not Modify): Observation [10022]       B Medical/Surgery History Past Medical History:  Diagnosis Date  . Arthritis    "left  knee" (07/19/2018)  . GERD (gastroesophageal reflux disease)    hx (07/19/2018)  . Headache    "q 4 months or so; from my pseudotumor" (07/19/2018)  . Heart murmur    "grew out of it" (07/19/2018)  . History of kidney stones   . Hypertension   . Pneumonia    "once" (07/19/2018)  . PONV (postoperative nausea and vomiting)   . Pseudotumor cerebri    Past Surgical History:  Procedure Laterality Date  . ABDOMINAL HYSTERECTOMY  1999  . BAND HEMORRHOIDECTOMY  2019  . 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 CHOLECYSTECTOMY N/A 07/20/2018   Procedure: LAPAROSCOPIC CHOLECYSTECTOMY WITH INTRAOPERATIVE CHOLANGIOGRAM;  Surgeon: Manus Rudd, MD;  Location: [facility] OR;  Service: General;  Laterality: N/A;  . COLECTOMY Left ~ 2010   2.5 feet  . CYST EXCISION Left 1988   "bleeding cyst in palm of my hand" (07/19/2018)  . CYST EXCISION Left 1969   "behind my ear"  . TUBAL LIGATION  1994     A IV Location/Drains/Wounds Patient Lines/Drains/Airways Status   Active Line/Drains/Airways    Name:   Placement date:   Placement time:   Site:   Days:   Peripheral IV 09/14/18 Left;Upper Forearm   09/14/18    0750    Forearm   less than 1   Incision (Closed) 07/20/18 Umbilicus   07/20/18  1128     58   Incision - 4 Ports Abdomen 1: Umbilicus 2: Mid;Upper 3: Right;Medial 4: Right;Lateral   07/20/18    -     58          Intake/Output Last 24 hours No intake or output data in the 24 hours ending 09/14/18 1314  Labs/Imaging Results for orders placed or performed during the hospital encounter of 09/14/18 (from the past 48 hour(s))  Lipase, blood     Status: None   Collection Time: 09/14/18  3:22 AM  Result Value Ref Range   Lipase 21 11 - 51 U/L    Comment: Performed at [facility], [address], [place], [place] [address]  Comprehensive metabolic panel     Status: Abnormal   Collection Time: 09/14/18  3:22 AM  Result Value Ref Range   Sodium 140 135 -  145 mmol/L   Potassium 2.9 (L) 3.5 - 5.1 mmol/L   Chloride 108 98 - 111 mmol/L   CO2 25 22 - 32 mmol/L   Glucose, Bld 138 (H) 70 - 99 mg/dL   BUN 12 6 - 20 mg/dL   Creatinine, Ser 6.22 0.44 - 1.00 mg/dL   Calcium 9.0 8.9 - 29.7 mg/dL   Total Protein 6.5 6.5 - 8.1 g/dL   Albumin 3.6 3.5 - 5.0 g/dL   AST 989 (H) 15 - 41 U/L   ALT 68 (H) 0 - 44 U/L   Alkaline Phosphatase 84 38 - 126 U/L   Total Bilirubin 0.5 0.3 - 1.2 mg/dL   GFR calc non Af Amer >60 >60 mL/min   GFR calc Af Amer >60 >60 mL/min   Anion gap 7 5 - 15    Comment: Performed at [facility], [address], [place], [place] [address]  CBC     Status: None   Collection Time: 09/14/18  3:22 AM  Result Value Ref Range   WBC 5.0 4.0 - 10.5 K/uL   RBC 4.12 3.87 - 5.11 MIL/uL   Hemoglobin 12.6 12.0 - 15.0 g/dL   HCT 17.4 08.1 - 44.8 %   MCV 93.0 80.0 - 100.0 fL   MCH 30.6 26.0 - 34.0 pg   MCHC 32.9 30.0 - 36.0 g/dL   RDW 18.5 63.1 - 49.7 %   Platelets 291 150 - 400 K/uL   nRBC 0.0 0.0 - 0.2 %    Comment: Performed at [facility], [address] [place], [place] [address]  Urinalysis, Routine w reflex microscopic     Status: Abnormal   Collection Time: 09/14/18 10:37 AM  Result Value Ref Range   Color, Urine YELLOW YELLOW   APPearance CLEAR CLEAR   Specific Gravity, Urine >1.046 (H) 1.005 - 1.030   pH 6.0 5.0 - 8.0   Glucose, UA NEGATIVE NEGATIVE mg/dL   Hgb urine dipstick NEGATIVE NEGATIVE   Bilirubin Urine NEGATIVE NEGATIVE   Ketones, ur NEGATIVE NEGATIVE mg/dL   Protein, ur 30 (A) NEGATIVE mg/dL   Nitrite NEGATIVE NEGATIVE   Leukocytes,Ua NEGATIVE NEGATIVE   RBC / HPF 0-5 0 - 5 RBC/hpf   WBC, UA 0-5 0 - 5 WBC/hpf   Bacteria, UA NONE SEEN NONE SEEN   Squamous Epithelial / LPF 0-5 0 - 5    Comment: Performed at [facility], [address], [place], [place] [address]   Ct Abdomen Pelvis W Contrast  Result Date: 09/14/2018 CLINICAL DATA:  Right upper quadrant pain,  history of cholecystectomy  several weeks ago EXAM: CT ABDOMEN AND PELVIS WITH CONTRAST TECHNIQUE: Multidetector CT imaging of the abdomen and pelvis was performed using the standard protocol following bolus administration of intravenous contrast. CONTRAST:  OMNIPAQUE IOHEXOL 300 MG/ML  SOLN COMPARISON:  07/19/2018 FINDINGS: Lower chest: No acute abnormality. Hepatobiliary: Liver is within normal limits. Changes of prior cholecystectomy are seen. No gallbladder fossa fluid collection is seen. No definitive ductal dilatation to suggest obstruction is seen. Pancreas: Unremarkable. No pancreatic ductal dilatation or surrounding inflammatory changes. Spleen: Normal in size without focal abnormality. Adrenals/Urinary Tract: Adrenal glands are within normal limits bilaterally. The kidneys show no renal calculi or obstructive change. Bilateral parapelvic cysts are noted. The bladder is partially distended. Stomach/Bowel: Postsurgical changes in the sigmoid colon are noted. No obstructive or inflammatory changes are seen. The appendix is within normal limits. No small bowel abnormality is noted. Stomach is decompressed. Vascular/Lymphatic: No significant vascular findings are present. No enlarged abdominal or pelvic lymph nodes. Reproductive: Status post hysterectomy. No adnexal masses. Other: No abdominal wall hernia or abnormality. No abdominopelvic ascites. Musculoskeletal: No acute or significant osseous findings. IMPRESSION: Changes of prior cholecystectomy. No right upper quadrant fluid collection is identified. No acute abnormality seen. Electronically Signed   By: Alcide Clever M.D.   On: 09/14/2018 08:17   US Abdomen Limited Ruq  Result Date: 09/14/2018 CLINICAL DATA:  Right upper quadrant pain EXAM: ULTRASOUND ABDOMEN LIMITED RIGHT UPPER QUADRANT COMPARISON:  None. FINDINGS: Gallbladder: Surgically removed Common bile duct: Diameter: 5 mm Liver: Mildly increased in echogenicity without focal mass lesion. No biliary ductal dilatation  is seen. Portal vein is patent on color Doppler imaging with normal direction of blood flow towards the liver. IMPRESSION: Status post cholecystectomy.  No ductal dilatation is noted. Mild increased echogenicity is noted within the liver without focal mass. This likely represents fatty infiltration. Electronically Signed   By: Alcide Clever M.D.   On: 09/14/2018 10:08    Pending Labs [employer] (From admission, onward)    Start     Ordered   Signed and Held  Comprehensive metabolic panel  Tomorrow morning,   R     Signed and Held   Signed and Held  CBC  Tomorrow morning,   R     Signed and Held          Vitals/Pain Today's Vitals   09/14/18 0845 09/14/18 0900 09/14/18 0915 09/14/18 1209  BP: 94/61 108/68 108/67 108/67  Pulse: (!) 101 100 89 80  Resp:    (!) 22  Temp:      TempSrc:      SpO2: 93% 100% 99% 95%  Weight:      Height:      PainSc:        Isolation Precautions No active isolations  Medications Medications  potassium chloride 10 mEq in 100 mL IVPB (has no administration in time range)  sodium chloride flush (NS) 0.9 % injection 3 mL (3 mLs Intravenous Given 09/14/18 0751)  morphine 4 MG/ML injection 4 mg (4 mg Intravenous Given 09/14/18 0751)  ondansetron (ZOFRAN) injection 4 mg (4 mg Intravenous Given 09/14/18 0751)  iohexol (OMNIPAQUE) 300 MG/ML solution 100 mL (100 mLs Intravenous Contrast Given 09/14/18 0800)  HYDROmorphone (DILAUDID) injection 1 mg (1 mg Intravenous Given 09/14/18 0917)  HYDROmorphone (DILAUDID) injection 0.5 mg (0.5 mg Intravenous Given 09/14/18 1238)  metoCLOPramide (REGLAN) injection 10 mg (10 mg Intravenous Given 09/14/18 1237)    Mobility walks Low fall  risk   Focused Assessments    R Recommendations: See Admitting Provider Note  Report given to:   Additional Notes:

## 2018-09-14 NOTE — ED Notes (Signed)
Patient transported to CT 

## 2018-09-15 ENCOUNTER — Encounter (HOSPITAL_COMMUNITY): Payer: Self-pay | Admitting: General Practice

## 2018-09-15 DIAGNOSIS — Z87891 Personal history of nicotine dependence: Secondary | ICD-10-CM | POA: Diagnosis not present

## 2018-09-15 DIAGNOSIS — X58XXXA Exposure to other specified factors, initial encounter: Secondary | ICD-10-CM | POA: Diagnosis not present

## 2018-09-15 DIAGNOSIS — B179 Acute viral hepatitis, unspecified: Secondary | ICD-10-CM | POA: Diagnosis present

## 2018-09-15 DIAGNOSIS — Z90710 Acquired absence of both cervix and uterus: Secondary | ICD-10-CM | POA: Diagnosis not present

## 2018-09-15 DIAGNOSIS — Z7982 Long term (current) use of aspirin: Secondary | ICD-10-CM | POA: Diagnosis not present

## 2018-09-15 DIAGNOSIS — M1990 Unspecified osteoarthritis, unspecified site: Secondary | ICD-10-CM | POA: Diagnosis present

## 2018-09-15 DIAGNOSIS — Z91018 Allergy to other foods: Secondary | ICD-10-CM | POA: Diagnosis not present

## 2018-09-15 DIAGNOSIS — Z91048 Other nonmedicinal substance allergy status: Secondary | ICD-10-CM | POA: Diagnosis not present

## 2018-09-15 DIAGNOSIS — G932 Benign intracranial hypertension: Secondary | ICD-10-CM | POA: Diagnosis present

## 2018-09-15 DIAGNOSIS — K760 Fatty (change of) liver, not elsewhere classified: Secondary | ICD-10-CM | POA: Diagnosis present

## 2018-09-15 DIAGNOSIS — Y92230 Patient room in hospital as the place of occurrence of the external cause: Secondary | ICD-10-CM | POA: Diagnosis present

## 2018-09-15 DIAGNOSIS — Z9049 Acquired absence of other specified parts of digestive tract: Secondary | ICD-10-CM | POA: Diagnosis not present

## 2018-09-15 DIAGNOSIS — E876 Hypokalemia: Secondary | ICD-10-CM | POA: Diagnosis present

## 2018-09-15 DIAGNOSIS — Z79899 Other long term (current) drug therapy: Secondary | ICD-10-CM | POA: Diagnosis not present

## 2018-09-15 DIAGNOSIS — Z87442 Personal history of urinary calculi: Secondary | ICD-10-CM | POA: Diagnosis not present

## 2018-09-15 DIAGNOSIS — Z8249 Family history of ischemic heart disease and other diseases of the circulatory system: Secondary | ICD-10-CM | POA: Diagnosis not present

## 2018-09-15 DIAGNOSIS — K219 Gastro-esophageal reflux disease without esophagitis: Secondary | ICD-10-CM | POA: Diagnosis present

## 2018-09-15 DIAGNOSIS — T7804XA Anaphylactic reaction due to fruits and vegetables, initial encounter: Secondary | ICD-10-CM | POA: Diagnosis not present

## 2018-09-15 DIAGNOSIS — I10 Essential (primary) hypertension: Secondary | ICD-10-CM | POA: Diagnosis present

## 2018-09-15 DIAGNOSIS — K5909 Other constipation: Secondary | ICD-10-CM | POA: Diagnosis present

## 2018-09-15 DIAGNOSIS — R1011 Right upper quadrant pain: Secondary | ICD-10-CM | POA: Diagnosis present

## 2018-09-15 DIAGNOSIS — R748 Abnormal levels of other serum enzymes: Secondary | ICD-10-CM | POA: Diagnosis not present

## 2018-09-15 DIAGNOSIS — Z885 Allergy status to narcotic agent status: Secondary | ICD-10-CM | POA: Diagnosis not present

## 2018-09-15 DIAGNOSIS — Z888 Allergy status to other drugs, medicaments and biological substances status: Secondary | ICD-10-CM | POA: Diagnosis not present

## 2018-09-15 LAB — CBC
HCT: 36.3 % (ref 36.0–46.0)
Hemoglobin: 11.7 g/dL — ABNORMAL LOW (ref 12.0–15.0)
MCH: 29.6 pg (ref 26.0–34.0)
MCHC: 32.2 g/dL (ref 30.0–36.0)
MCV: 91.9 fL (ref 80.0–100.0)
Platelets: 263 10*3/uL (ref 150–400)
RBC: 3.95 MIL/uL (ref 3.87–5.11)
RDW: 13.4 % (ref 11.5–15.5)
WBC: 2.5 10*3/uL — AB (ref 4.0–10.5)
nRBC: 0 % (ref 0.0–0.2)

## 2018-09-15 LAB — COMPREHENSIVE METABOLIC PANEL
ALBUMIN: 3.2 g/dL — AB (ref 3.5–5.0)
ALT: 1605 U/L — ABNORMAL HIGH (ref 0–44)
ALT: 1733 U/L — ABNORMAL HIGH (ref 0–44)
ALT: 1551 U/L — AB (ref 0–44)
AST: 1728 U/L — ABNORMAL HIGH (ref 15–41)
AST: 1579 U/L — AB (ref 15–41)
AST: 1167 U/L — ABNORMAL HIGH (ref 15–41)
Albumin: 3.6 g/dL (ref 3.5–5.0)
Albumin: 3.5 g/dL (ref 3.5–5.0)
Alkaline Phosphatase: 218 U/L — ABNORMAL HIGH (ref 38–126)
Alkaline Phosphatase: 261 U/L — ABNORMAL HIGH (ref 38–126)
Alkaline Phosphatase: 278 U/L — ABNORMAL HIGH (ref 38–126)
Anion gap: 5 (ref 5–15)
Anion gap: 8 (ref 5–15)
Anion gap: 7 (ref 5–15)
BILIRUBIN TOTAL: 2.7 mg/dL — AB (ref 0.3–1.2)
BUN: 6 mg/dL (ref 6–20)
BUN: 6 mg/dL (ref 6–20)
BUN: 7 mg/dL (ref 6–20)
CO2: 27 mmol/L (ref 22–32)
CO2: 25 mmol/L (ref 22–32)
CO2: 24 mmol/L (ref 22–32)
Calcium: 8.7 mg/dL — ABNORMAL LOW (ref 8.9–10.3)
Calcium: 8.8 mg/dL — ABNORMAL LOW (ref 8.9–10.3)
Calcium: 8.8 mg/dL — ABNORMAL LOW (ref 8.9–10.3)
Chloride: 109 mmol/L (ref 98–111)
Chloride: 107 mmol/L (ref 98–111)
Chloride: 109 mmol/L (ref 98–111)
Creatinine, Ser: 0.77 mg/dL (ref 0.44–1.00)
Creatinine, Ser: 0.73 mg/dL (ref 0.44–1.00)
Creatinine, Ser: 0.8 mg/dL (ref 0.44–1.00)
GFR calc Af Amer: >60 mL/min (ref >60)
GFR calc Af Amer: >60 mL/min (ref >60)
GFR calc non Af Amer: >60 mL/min (ref >60)
GFR calc non Af Amer: >60 mL/min (ref >60)
Glucose, Bld: 99 mg/dL (ref 70–99)
Glucose, Bld: 90 mg/dL (ref 70–99)
Glucose, Bld: 107 mg/dL — ABNORMAL HIGH (ref 70–99)
POTASSIUM: 3.7 mmol/L (ref 3.5–5.1)
Potassium: 3.7 mmol/L (ref 3.5–5.1)
Potassium: 3.5 mmol/L (ref 3.5–5.1)
Sodium: 141 mmol/L (ref 135–145)
Sodium: 140 mmol/L (ref 135–145)
Sodium: 140 mmol/L (ref 135–145)
Total Bilirubin: 2.8 mg/dL — ABNORMAL HIGH (ref 0.3–1.2)
Total Bilirubin: 1.8 mg/dL — ABNORMAL HIGH (ref 0.3–1.2)
Total Protein: 6 g/dL — ABNORMAL LOW (ref 6.5–8.1)
Total Protein: 6.6 g/dL (ref 6.5–8.1)
Total Protein: 6.4 g/dL — ABNORMAL LOW (ref 6.5–8.1)

## 2018-09-15 LAB — PROTIME-INR
INR: 1 (ref 0.8–1.2)
INR: 1.1 (ref 0.8–1.2)
PROTHROMBIN TIME: 13.6 s (ref 11.4–15.2)
Prothrombin Time: 13.4 seconds (ref 11.4–15.2)

## 2018-09-15 LAB — CK: Total CK: 209 U/L (ref 38–234)

## 2018-09-15 LAB — TRANSFERRIN: Transferrin: 188 mg/dL — ABNORMAL LOW (ref 192–382)

## 2018-09-15 LAB — IRON AND TIBC
Iron: 114 ug/dL (ref 28–170)
Saturation Ratios: 43 % — ABNORMAL HIGH (ref 10.4–31.8)
TIBC: 263 ug/dL (ref 250–450)
UIBC: 149 ug/dL

## 2018-09-15 LAB — FERRITIN: Ferritin: 717 ng/mL — ABNORMAL HIGH (ref 11–307)

## 2018-09-15 LAB — ACETAMINOPHEN LEVEL: Acetaminophen (Tylenol), Serum: <10 ug/mL — ABNORMAL LOW (ref 10–30)

## 2018-09-15 MED ORDER — HYDROMORPHONE HCL 1 MG/ML IJ SOLN
0.5000 mg | INTRAMUSCULAR | Status: DC | PRN
  Administered 2018-09-15 – 2018-09-16 (×3): 0.5 mg via INTRAVENOUS
  Filled 2018-09-15 (×3): qty 1

## 2018-09-15 NOTE — Progress Notes (Signed)
Theresa Case 10:30 AM  Subjective: Patient doing a little better but still with crampy upper abdominal pain and we discussed her MRI as well as her liver tests and she takes a special herbal tea at home to help her move her bowels you may want to look into having a family member bring that for her no new complaints and tolerating clear liquids  Objective: Vital signs stable afebrile no acute distress abdomen is still little tender without guarding or rebound occasional bowel sounds white count okay liver tests increased significantly MRCP reviewed and okay  Assessment: Questionable passed CBD stone with duct edema explaining increased liver tests  Plan: Observe 1 more day repeat liver tests tomorrow and consider EUS versus ERCP which we discussed and may advance diet if doing better later today  [facility] E  Pager [phone] After 5PM or if no answer call [phone]

## 2018-09-15 NOTE — Progress Notes (Signed)
Upon assessment this nurse noted swelling bilaterally in patient hands. Notified resident on call.

## 2018-09-15 NOTE — Progress Notes (Signed)
Pt has intermittent abdominal pain. Abdomen soft and non distended. Pt has no bowel movements and only urinated once during shift at 6:50am. Urine appeared dark in color (orange). And patient has no complaint of trouble urinating. Hand off given to next nurse to continue communication w/provider  and completing care plan for patient.

## 2018-09-15 NOTE — Progress Notes (Addendum)
Subjective: No acute events overnight. This morning, Theresa Case pain is mildly improved from yesterday. It comes and goes throughout our interview. Her abdomen feels less swollen. She was able to tolerate tea and pudding this morning. She has no nausea/vomiting, fever/chills, headache, or confusion. She has not had a bowel movement or any gas since Monday. She drinks Iaso Tea to help with daily bowel movements and weight loss. She clarifies that she has not had tylenol for weeks, and did not use any pain medication after her cholecystectomy. 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   Upon re-evaluation this afternoon, Theresa Case continues to have intermittent abdominal pain in the right upper quadrant and epigastric region. She does not feel confused, and her family does not endorse any signs of confusion. She is still tolerating intake.   Objective:  Vital signs in last 24 hours: Vitals:   09/14/18 1344 09/15/18 0103 09/15/18 0638 09/15/18 1108  BP: 106/77 103/68 106/73 119/79  Pulse: 67 77 75 77  Resp:  _0 Temp:  99.1 F (37.3 C) 99.1 F (37.3 C) 98.5 F (36.9 C)  TempSrc:  Oral Oral Oral  SpO2: 95% 94% 96% 98%  Weight:      Height:       Physical exam: HEENT: mild scleral icterus, dark rings noted around both irises. No visual changes.  Cardiovascular: regular rate and rhythm, no murmurs Respiratory: lungs are cta bilaterally. Good respiratory effort Abdominal: epigastric and ruq tenderness. Voluntary guarding. No organomegaly or distention appreciated Neurologic: alert and oriented x3. No focal neurologic deficits Psychiatric: calm, no hallucinations, illusions, or signs of psychosis  CMP Latest Ref Rng & Units 09/15/2018 09/15/2018 09/14/2018  Glucose 70 - 99 mg/dL 90 99 138(H)  BUN 6 - 20 mg/dL _1 Creatinine 0.44 - 1.00 mg/dL 0.73 0.77 0.83  Sodium 135 - 145 mmol/L 140 141 140  Potassium 3.5 - 5.1 mmol/L 3.7 3.7 2.9(L)  Chloride 98 -  111 mmol/L 107 109 108  CO2 22 - 32 mmol/L _2 Calcium 8.9 - 10.3 mg/dL 8.8(L) 8.7(L) 9.0  Total Protein 6.5 - 8.1 g/dL 6.6 6.0(L) 6.5  Total Bilirubin 0.3 - 1.2 mg/dL 2.8(H) 2.7(H) 0.5  Alkaline Phos 38 - 126 U/L 261(H) 218(H) 84  AST 15 - 41 U/L 1,579(H) 1,728(H) 116(H)  ALT 0 - 44 U/L 1,733(H) 1,605(H) 68(H)   Ferritin 717 INR 1.0 CK 209   Assessment/Plan:  Principal Problem:   Acute hepatitis Active Problems:   Essential hypertension   Pseudotumor cerebri   Constipation  Theresa Case is a 58 y.o f w/ a pmh of cholecystitis s/p cholecystectomy 7 weeks ago, chronic constipation, and pseudotumor cerebri who presents on 3/10 with 1 day of epigastric & RUQ abdominal pain. Pt afebrile, lipase normal, CT scan unremarkable. MRCP on 3/10 showed no retained gallstone and patent portal vein. AST and ALT in the 1000's on 3/11, INR 1.0, normal mental status  Acute Hepatitis: Pt originally presented for abdominal pain. Retained gallstone was ruled out with negative MRCP. Pancreatitis was ruled out with low lipase. Pt's transaminases rose dramatically from 3/10 to 3/11 (AST: 116 --> 1728, ALT: 68 --> 1605). Repeat transaminases on 3/11 showed AST 1579 and ALT 1733. Total bilirubin 2.8. Alk Phos 261. Ferritin 717. CK 209. INR 1.0. Differential includes viral, hemochromatosis, autoimmune, wilson's disease, idiopathic. Viral hepatitis less likely given lack of exposure history. Wilson's disease worth considering given  atypical iris pigmentation, but unlikely given alk phos/bilirubin ratio >4. Hemochromatosis worth considering given elevated ferritin. Tylenol-induced hepatitis ruled out given serum tylenol <10. Ischemic hepatitis unlikely given lack of hypotensive events. Budd-chiari ruled out with patent portal vein on Doppler. Appreciate GI consultation. -GI on board -viral hepatitis panel pending -serum ceruloplasmin pending -iron studies pending -IgG pending -pain is currently under  reasonable control. 0.5 mg dilaudid prn q4hrs  -nausea management w/ ondansetron prn -full liquid diet, advance as tolerated -LFTs q6hr -INR q6hr -monitor mental status closely  Hypokalemia likely 2/2 vomiting -resolved on 3/11: K+ of 3.7 -pt tolerating oral fluids, d/c maintenance IVFs -cardiac monitoring  Chronic Constipation: s/p partial colectomy, requires regular laxatives. KUB with moderate stool burden. Pt had minimal success with miralax in the past and may require her home supplement. -schedule miralax and sennacot   Dispo: Anticipated discharge in approximately 2 day(s).   LOS: 0 days   Theresa Case, Medical Student 09/15/2018, 2:15 PM Pager: [phone]  Attestation for Student Documentation:  I personally was present and performed or re-performed the history, physical exam and medical decision-making activities of this service and have verified that the service and findings are accurately documented in the student's note.  Velna Ochs, MD 09/15/2018, 3:18 PM

## 2018-09-16 LAB — COMPREHENSIVE METABOLIC PANEL
ALK PHOS: 240 U/L — AB (ref 38–126)
ALT: 1271 U/L — ABNORMAL HIGH (ref 0–44)
ALT: 1230 U/L — ABNORMAL HIGH (ref 0–44)
AST: 916 U/L — ABNORMAL HIGH (ref 15–41)
AST: 777 U/L — ABNORMAL HIGH (ref 15–41)
Albumin: 3.1 g/dL — ABNORMAL LOW (ref 3.5–5.0)
Albumin: 3.1 g/dL — ABNORMAL LOW (ref 3.5–5.0)
Alkaline Phosphatase: 248 U/L — ABNORMAL HIGH (ref 38–126)
Anion gap: 5 (ref 5–15)
Anion gap: 7 (ref 5–15)
BILIRUBIN TOTAL: 2.5 mg/dL — AB (ref 0.3–1.2)
BILIRUBIN TOTAL: 2.7 mg/dL — AB (ref 0.3–1.2)
BUN: 5 mg/dL — ABNORMAL LOW (ref 6–20)
BUN: 5 mg/dL — ABNORMAL LOW (ref 6–20)
CO2: 24 mmol/L (ref 22–32)
CO2: 25 mmol/L (ref 22–32)
Calcium: 8.6 mg/dL — ABNORMAL LOW (ref 8.9–10.3)
Calcium: 8.4 mg/dL — ABNORMAL LOW (ref 8.9–10.3)
Chloride: 110 mmol/L (ref 98–111)
Chloride: 107 mmol/L (ref 98–111)
Creatinine, Ser: 0.75 mg/dL (ref 0.44–1.00)
Creatinine, Ser: 0.8 mg/dL (ref 0.44–1.00)
GFR calc Af Amer: >60 mL/min (ref >60)
Glucose, Bld: 98 mg/dL (ref 70–99)
Glucose, Bld: 102 mg/dL — ABNORMAL HIGH (ref 70–99)
Potassium: 3.5 mmol/L (ref 3.5–5.1)
Potassium: 3.6 mmol/L (ref 3.5–5.1)
Sodium: 139 mmol/L (ref 135–145)
Sodium: 139 mmol/L (ref 135–145)
TOTAL PROTEIN: 5.8 g/dL — AB (ref 6.5–8.1)
Total Protein: 5.8 g/dL — ABNORMAL LOW (ref 6.5–8.1)

## 2018-09-16 LAB — CBC WITH DIFFERENTIAL/PLATELET
Abs Immature Granulocytes: 0.01 10*3/uL (ref 0.00–0.07)
Basophils Absolute: 0 10*3/uL (ref 0.0–0.1)
Basophils Relative: 1 %
EOS PCT: 4 %
Eosinophils Absolute: 0.1 10*3/uL (ref 0.0–0.5)
HCT: 36.4 % (ref 36.0–46.0)
Hemoglobin: 11.9 g/dL — ABNORMAL LOW (ref 12.0–15.0)
Immature Granulocytes: 0 %
Lymphocytes Relative: 38 %
Lymphs Abs: 1.2 10*3/uL (ref 0.7–4.0)
MCH: 29.9 pg (ref 26.0–34.0)
MCHC: 32.7 g/dL (ref 30.0–36.0)
MCV: 91.5 fL (ref 80.0–100.0)
Monocytes Absolute: 0.2 10*3/uL (ref 0.1–1.0)
Monocytes Relative: 8 %
Neutro Abs: 1.5 10*3/uL — ABNORMAL LOW (ref 1.7–7.7)
Neutrophils Relative %: 49 %
Platelets: 265 10*3/uL (ref 150–400)
RBC: 3.98 MIL/uL (ref 3.87–5.11)
RDW: 13.2 % (ref 11.5–15.5)
WBC: 3.1 10*3/uL — AB (ref 4.0–10.5)
nRBC: 0 % (ref 0.0–0.2)

## 2018-09-16 LAB — PROTIME-INR
INR: 1.1 (ref 0.8–1.2)
Prothrombin Time: 13.6 seconds (ref 11.4–15.2)

## 2018-09-16 LAB — HEPATITIS A ANTIBODY, IGM: Hep A IgM: NEGATIVE

## 2018-09-16 LAB — IGG: IgG (Immunoglobin G), Serum: 1009 mg/dL (ref 700–1600)

## 2018-09-16 LAB — HEPATITIS B CORE ANTIBODY, IGM: Hep B C IgM: NEGATIVE

## 2018-09-16 LAB — HCV AB W REFLEX TO QUANT PCR: HCV Ab: <0.1 s/co ratio (ref 0.0–0.9)

## 2018-09-16 LAB — HCV INTERPRETATION

## 2018-09-16 LAB — HEPATITIS B SURFACE ANTIGEN: Hepatitis B Surface Ag: NEGATIVE

## 2018-09-16 LAB — CERULOPLASMIN: Ceruloplasmin: 32.4 mg/dL (ref 19.0–39.0)

## 2018-09-16 LAB — HEPATITIS B SURFACE ANTIBODY, QUANTITATIVE: Hep B S AB Quant (Post): 373 m[IU]/mL (ref >9.9)

## 2018-09-16 MED ORDER — WHITE PETROLATUM EX OINT
TOPICAL_OINTMENT | CUTANEOUS | Status: AC
  Administered 2018-09-16: 06:00:00
  Filled 2018-09-16: qty 28.35

## 2018-09-16 MED ORDER — WHITE PETROLATUM EX OINT
TOPICAL_OINTMENT | CUTANEOUS | Status: AC
  Filled 2018-09-16: qty 28.35

## 2018-09-16 MED ORDER — EPINEPHRINE 0.3 MG/0.3ML IJ SOAJ
0.3000 mg | INTRAMUSCULAR | Status: DC | PRN
  Filled 2018-09-16: qty 0.3

## 2018-09-16 MED ORDER — EPINEPHRINE 0.3 MG/0.3ML IJ SOAJ
0.3000 mg | INTRAMUSCULAR | Status: AC
  Administered 2018-09-16: 0.3 mg via INTRAMUSCULAR
  Filled 2018-09-16: qty 0.3

## 2018-09-16 MED ORDER — SODIUM CHLORIDE 0.9 % IV SOLN
INTRAVENOUS | Status: AC
  Administered 2018-09-16: 19:00:00 via INTRAVENOUS

## 2018-09-16 MED ORDER — SODIUM CHLORIDE 0.9 % IV BOLUS
1000.0000 mL | Freq: Once | INTRAVENOUS | Status: AC
  Administered 2018-09-16: 1000 mL via INTRAVENOUS

## 2018-09-16 MED ORDER — EPINEPHRINE 0.3 MG/0.3ML IJ SOAJ
0.3000 mg | INTRAMUSCULAR | Status: AC
  Filled 2018-09-16: qty 0.3

## 2018-09-16 MED ORDER — EPINEPHRINE PF 1 MG/ML IJ SOLN
INTRAMUSCULAR | Status: AC
  Filled 2018-09-16: qty 1

## 2018-09-16 MED ORDER — DIPHENHYDRAMINE HCL 50 MG/ML IJ SOLN: 25.0000 mg | INTRAMUSCULAR | Status: DC | PRN

## 2018-09-16 MED ORDER — DIPHENHYDRAMINE HCL 50 MG/ML IJ SOLN
25.0000 mg | Freq: Four times a day (QID) | INTRAMUSCULAR | Status: DC | PRN
  Administered 2018-09-16: 25 mg via INTRAVENOUS

## 2018-09-16 MED ORDER — DIPHENHYDRAMINE HCL 50 MG/ML IJ SOLN
INTRAMUSCULAR | Status: AC
  Filled 2018-09-16: qty 1

## 2018-09-16 MED ORDER — ENSURE MAX PROTEIN PO LIQD
11.0000 [oz_av] | Freq: Two times a day (BID) | ORAL | Status: DC
  Administered 2018-09-17 – 2018-09-18 (×3): 11 [oz_av] via ORAL
  Filled 2018-09-16 (×6): qty 330

## 2018-09-16 MED ORDER — EPINEPHRINE 0.3 MG/0.3ML IJ SOAJ
0.3000 mg | INTRAMUSCULAR | Status: DC | PRN
  Filled 2018-09-16 (×2): qty 0.3

## 2018-09-16 MED ORDER — EPINEPHRINE PF 1 MG/10ML IJ SOSY
PREFILLED_SYRINGE | INTRAMUSCULAR | Status: AC
  Filled 2018-09-16: qty 10

## 2018-09-16 NOTE — Progress Notes (Addendum)
Initial Nutrition Assessment  DOCUMENTATION CODES:   Obesity unspecified  INTERVENTION:  Ensure Max po BID, each supplement provides 150 kcal and 30 grams of protein.   NUTRITION DIAGNOSIS:   Inadequate oral intake related to altered GI function(cholecystectomy and new diet pattern.) as evidenced by per patient/family report, percent weight loss.   GOAL:   Patient will meet greater than or equal to 90% of their needs   MONITOR:   PO intake, Supplement acceptance, Weight trends, Labs  REASON FOR ASSESSMENT:   Malnutrition Screening Tool(MST 2)    ASSESSMENT:   Pt is  A 57y F with PMH of Obesity, cholecystectomy (Jan 2020) for chronic cholecystitis and cholelithiasis. Pt now admitted with abdominal pain and found to have acute hepatitis, with no encephalopathy, no alcohol, and no tylenol exposure.   Pt has a 'fair' appetite stating that she was just changed from full liquids to regular and that "I am not really hungry but I eat because I know I need to." Prior to Jan 2020 surgery diet was very high fat. With cholecystectomy, pt has transitioned to a very low fat diet per MD orders and is eating well, but same foods daily and is fearful of getting burnt out. Typical diet currently is a Olive oil flavored with butter extract and bagel  For breakfast. Sometimes 2 mini sweet potato muffins. Typical lunch and dinners are chicken, lean fish, or similar protein, vegetables (tri-color peppers), and sweet potatoes. Pt would like to try premier protein but is worried about fat content, pt is agreeable to trying ensure max (premier substitute).   Pt endorses weight loss, 3 months ago before surgery weighing 254#. Since, she was dropping 2-3# a week having switched to a very low fat diet. Monday 08/15/2018 weighing 229#, to today weighing 225.5#. This would indicate 11.2% weight loss over 3 months, significant for time frame.   Pt walks unaided at baseline. Pt doesn't have any issues chewing  /swallowing.     Labs reviewed:  Corrected Ca 9.12 (N) AS 777(H)T ALT 1230(H)  Medications reviewed.   NUTRITION - FOCUSED PHYSICAL EXAM:    Most Recent Value  Orbital Region  No depletion  Upper Arm Region  No depletion  Thoracic and Lumbar Region  No depletion  Buccal Region  No depletion  Temple Region  No depletion  Clavicle Bone Region  No depletion  Clavicle and Acromion Bone Region  No depletion  Scapular Bone Region  No depletion  Dorsal Hand  No depletion  Patellar Region  No depletion  Anterior Thigh Region  No depletion  Edema (RD Assessment)  None  Hair  Reviewed  Eyes  Reviewed  Mouth  Reviewed  Skin  Reviewed  Nails  Reviewed       Diet Order:   Diet Order            Diet regular Room service appropriate? Yes; Fluid consistency: Thin  Diet effective now              EDUCATION NEEDS:   Education needs have been addressed  Skin:  Skin Assessment: Reviewed RN Assessment  Last BM:  3/11  Height:   Ht Readings from Last 1 Encounters:  09/14/18 5\' 7"  (1.702 m)    Weight:   Wt Readings from Last 1 Encounters:  09/14/18 102.5 kg    Ideal Body Weight:  61.4 kg  BMI:  Body mass index is 35.4 kg/m.  Estimated Nutritional Needs:   Kcal:  1700-1900  Protein:  90-105 grams  Fluid:  >2L    Burnard Bunting, [facility] Dietetic Intern

## 2018-09-16 NOTE — Significant Event (Signed)
Rapid Response Event Note  Overview:  Pt had eaten strawberries out of her fruit cup. 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 Requested they call pharmacy for a STAT epi pen     Initial Focused Assessment: Dr. Antony Contras at bedside on arrival. Epi injection IM given prior to arrival. Pt supine in bed, RR 24, O2 sats 98% RA. RN placing pt on NRB. Pt in no distress on my arrival. Tingling and itching to mouth and back of throat has resolved.   Interventions: Transfer to 4e for closer monitoring EKG NRB Epi IM x1 25 mg Benadryl IVP    Event Summary: 1711-1850   at      at          [facility], Sandi Carne

## 2018-09-16 NOTE — Progress Notes (Addendum)
Subjective: Overnight, Mrs. Devivo had an episode of swollen hands, nausea, and vomiting after trying some soup. She had no difficulty breathing or swollen lips. She had tried the soup for lunch without any symptoms. This episode lasted about 20 minutes before resolving. Mrs. Adornetto had intermittent abdominal pain throughout the night, but did not require pain medication.   This morning, Mrs. Verser endorses continued intermittent pain. She has an appetite and was able to eat a few bites of egg and an english muffin. She is drinking fluids without any nausea or vomiting. She has no new symptoms. She has not had a bowel movement since admission. She is curious as to why her liver enzymes were so elevated.  Objective:  Vital signs in last 24 hours: Vitals:   09/15/18 1108 09/15/18 1500 09/15/18 2021 09/16/18 0609  BP: 119/79 134/85 109/74 109/78  Pulse: 77 77 77 68  Resp: 20 19 18 18   Temp: 98.5 F (36.9 C) 99.1 F (37.3 C) 98.6 F (37 C) 98.6 F (37 C)  TempSrc: Oral Oral Oral Oral  SpO2: 98% 100% 95% 94%  Weight:      Height:       Physical exam: HEENT: mild scleral icterus, dark pigmented rings noted around both irises, no facial edema Cardiovascular: rrr, no murmurs Respiratory: lungs cta bilaterally. Good respiratory effort Abdominal: epigastric and ruq tenderness. Voluntary guarding. No organomegaly or distention appreciated Extremities: edema in hands resolved from overnight Neurologic: alert and oriented x3. No focal neurologic deficits   CMP Latest Ref Rng & Units 09/16/2018 09/15/2018 09/15/2018  Glucose 70 - 99 mg/dL 161(W) 98 960(A)  BUN 6 - 20 mg/dL 5(L) 5(L) 7  Creatinine 0.44 - 1.00 mg/dL 5.40 9.81 1.91  Sodium 135 - 145 mmol/L 139 139 140  Potassium 3.5 - 5.1 mmol/L 3.6 3.5 3.5  Chloride 98 - 111 mmol/L 107 110 109  CO2 22 - 32 mmol/L 25 24 24   Calcium 8.9 - 10.3 mg/dL 4.7(W) 2.9(F) 6.2(Z)  Total Protein 6.5 - 8.1 g/dL 3.0(Q) 6.5(H) 6.4(L)  Total Bilirubin 0.3  - 1.2 mg/dL 2.7(H) 2.5(H) 1.8(H)  Alkaline Phos 38 - 126 U/L 248(H) 240(H) 278(H)  AST 15 - 41 U/L 777(H) 916(H) 1,167(H)  ALT 0 - 44 U/L 1,230(H) 1,271(H) 1,551(H)   CBC Latest Ref Rng & Units 09/16/2018 09/15/2018 09/14/2018  WBC 4.0 - 10.5 K/uL 3.1(L) 2.5(L) 5.0  Hemoglobin 12.0 - 15.0 g/dL 11.9(L) 11.7(L) 12.6  Hematocrit 36.0 - 46.0 % 36.4 36.3 38.3  Platelets 150 - 400 K/uL 265 263 291   INR 1.1 Iron 114 TIBC 263 Transferrin 188 Ceruloplasmin 32.4 IgG 1,009 Hep A IgM Negative HbSAb Negative HbCAb Negative Hep B-Post 373 HCV Ab <0.1  Assessment/Plan:  Principal Problem:   Acute hepatitis Active Problems:   Essential hypertension   Pseudotumor cerebri   Constipation  Acute Hepatitis: Pt's transaminases peaked on 3/11 (AST 1579, ALT 1733) and have improved overnight (repeat AST 777, ALT 1230). INR stable at 1.1. No signs of encephalopathy. Viral hepatitis panel was negative. IgG was WNL, ceruloplasmin was WNL, iron studies not indicative of hemochromatosis. Per GI, most likely explanation of abdominal pain and acute hepatitis is a retained gallstone in the CBD which passed before admission. Appreciate GI consultation. -GI on board, considering ERCP to evaluate for ductal system inflammation. - 0.5 mg dilaudid prn q4hrs  -nausea management w/ ondansetronprn -patient advanced to full diet -LFTs daily -monitor mental status closely   Chronic Constipation: s/p partial colectomy, requires  regular laxatives. KUB with moderate stool burden.Pt had minimal success with miralax in the past and may require her home supplement. -miralax and sennacot  Dispo: Anticipated discharge in approximately 1 day(s).   LOS: 1 day   Helayne Seminole, Medical Student 09/16/2018, 10:47 AM Pager: [phone]  Attestation for Student Documentation:  I personally was present and performed or re-performed the history, physical exam and medical decision-making activities of this service and have  verified that the service and findings are accurately documented in the student's note.  Reymundo Poll, MD 09/16/2018, 12:22 PM

## 2018-09-16 NOTE — Progress Notes (Signed)
Theresa Case 6:25 PM  Subjective: Patient currently with an allergic reaction and lots of family at the bedside and her abdomen has been a little better today and the allergy was caused by eating strawberries which she is allergic to and we discussed EUS versus ERCP and answered all of their questions  Objective: Vital signs stable afebrile breathing with mask on rash seems to be better abdomen is still a little right upper quadrant discomfort but soft liver test still significantly elevated CBC okay acute hepatitis serologies all negative  Assessment: Questionable CBD stone  Plan: We will keep n.p.o. after midnight and consider ERCP versus EUS if performing doctor is available but will wait on repeat labs in the morning and will check on her hopefully in the morning  [facility]  Pager [phone] After 5PM or if no answer call [phone]

## 2018-09-16 NOTE — Progress Notes (Addendum)
Secretary called this RN at [employer] stating that room 10 called out and stated she had strawberries on her meal tray and the patient is allergic to strawberries. I arrived to the pt. Room at 1700. When I arrived the pt. Had labored breathing, increased RR of 24 and was stating her throat felt scratchy and her lips were tingling. I called my charge nurse Rakita and I paged Cephas Darby, MD who stated he would be up to see the pt. I placed the pt. On 2 liters of oxygen and Rakita obtained vital signs. MD Romeo Apple and Guilloud arrived and placed an order for an epi pen which had to be ordered from pharmacy. I paged pharmacy to send epi pen up STAT. MD ordered the pt. To be put on non-re breather. Once on the non-re breather the pt. Began to stat 100%. Epi pen arrived and was given by charge nurse Rakita at [phone]. Pt. Breathing began to become regular. Pt. Stated she was itching, so MD Guilloud ordered pt. Benadryl 25mg  to be given IV which was given at 1725. Orders for the pt. To be transferred to a progressive care unit was placed and report was attempted at 1753 and given at 1802 to the receiving nurse on [facility] [place], RN. Pt. Transported to [facility] with [employer], Rapid Response Nurse Verlon Au and this RN.

## 2018-09-16 NOTE — Progress Notes (Addendum)
Paged to bedside for patient having an allergic reaction while eating dinner. 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 She does not remember the last time her mother had a reaction as they are very careful to avoid strawberries. Unfortunately there was a strawberry in her fruit cup at dinner. She did not initially see it and ate the fruit on top.   On arrival, patient was leaning over the side of the bed dry heaving and gasping for air. She felt that her throat was closing and her lips were tingling and swelling. She was giving epinephrine 0.3 mg IM x 1 with immediate improvement in her breathing. On exam she had some mild upper airway stridor, lung were clear. Mild swelling of the posterior oropharynx and lips. Early development of hives on her chest that improved with IV benadryl.    Per daughter, she also has a mild allergy to tomatoes with some lip tingling and swelling but no anaphylaxis. She has multiple medication allergies (cephalosporins, magnesium, and verapamil) but no other food allergies that she is aware of.   Plan: -- 0.3 mg IM Epi q 5-10 minutes for anaphylaxis (respiratory distress, throat swelling, hives) -- Keep EpiPen at bedside  -- IV benadryl 25 mg x1; will try and avoid further doses as this can mask symptoms  -- Full set of vitals -- EKG -- Non rebreather -- 1L NS bolus then 100 cc/hr  -- Will transfer to progressive for closer monitoring   Reymundo Poll, M.D. - PGY3 Pager: [phone] 09/16/2018, 5:45 PM

## 2018-09-17 DIAGNOSIS — T7800XA Anaphylactic reaction due to unspecified food, initial encounter: Secondary | ICD-10-CM | POA: Diagnosis not present

## 2018-09-17 LAB — COMPREHENSIVE METABOLIC PANEL
ALT: 1090 U/L — AB (ref 0–44)
AST: 618 U/L — AB (ref 15–41)
Albumin: 3 g/dL — ABNORMAL LOW (ref 3.5–5.0)
Alkaline Phosphatase: 253 U/L — ABNORMAL HIGH (ref 38–126)
Anion gap: 6 (ref 5–15)
BUN: <5 mg/dL — ABNORMAL LOW (ref 6–20)
CO2: 24 mmol/L (ref 22–32)
Calcium: 8.5 mg/dL — ABNORMAL LOW (ref 8.9–10.3)
Chloride: 109 mmol/L (ref 98–111)
Creatinine, Ser: 0.76 mg/dL (ref 0.44–1.00)
GFR calc non Af Amer: >60 mL/min (ref >60)
Glucose, Bld: 117 mg/dL — ABNORMAL HIGH (ref 70–99)
POTASSIUM: 3.7 mmol/L (ref 3.5–5.1)
Sodium: 139 mmol/L (ref 135–145)
Total Bilirubin: 2.3 mg/dL — ABNORMAL HIGH (ref 0.3–1.2)
Total Protein: 5.9 g/dL — ABNORMAL LOW (ref 6.5–8.1)

## 2018-09-17 NOTE — Progress Notes (Signed)
Subjective: At 6pm on 3/12 Theresa Case incidentally consumed a strawberry and had an anaphylactic reaction. She had shortness of breath, a sensation of her throat closing, nausea and vomiting, diffuse itching, and an urticarial rash. She received 0.3 mg epinephrine and 25 mg benadryl. Her symptoms resolved. She was transferred to a progressive unit and placed on telemetry for closer monitoring. She had no more episodes of shortness of breath, rash, or nausea overnight.  This morning, Theresa Case is doing well. She continues to have a raspy voice, but her other symptoms have resolved. She no longer feels a lump in her throat. She has no itching or rash. Her abdominal pain has improved, and she has a good appetite. Before her anaphylactic reaction yesterday, Theresa Case had a full meal without nausea, vomiting, or abdominal pain. She had a lunch of chicken noodle soup without any symptoms. She has not had anything to eat or drink since last night, but has an appetite.   Objective:  Vital signs in last 24 hours: Vitals:   09/16/18 1938 09/16/18 2356 09/17/18 0342 09/17/18 0818  BP: 117/79 107/76 104/73 114/80  Pulse: 74 74 75   Resp: 16  14   Temp: 98 F (36.7 C) 98 F (36.7 C) 98.2 F (36.8 C) 98.2 F (36.8 C)  TempSrc: Oral Oral Oral Oral  SpO2: 97% 94% 94%   Weight:      Height:       General: Pt is fatigued, resting in bed.  HEENT: no scleral icterus (improved from 3/12), dark pigmented rings noted around both irises, no facial edema. No pharyngeal or tongue edema. Hoarse voice. Cardiovascular: rrr, no murmurs Respiratory: lungs cta bilaterally. Good respiratory effort Abdominal: epigastric and ruq tenderness, improved from 3/12. No organomegaly or distention appreciated Extremities: no edema or rash Neurologic: alert and oriented x3. No focal neurologic deficits  CMP Latest Ref Rng & Units 09/17/2018 09/16/2018 09/15/2018  Glucose 70 - 99 mg/dL 435(W) 861(U) 98  BUN 6 - 20 mg/dL  <8(H) 5(L) 5(L)  Creatinine 0.44 - 1.00 mg/dL 7.29 0.21 1.15  Sodium 135 - 145 mmol/L 139 139 139  Potassium 3.5 - 5.1 mmol/L 3.7 3.6 3.5  Chloride 98 - 111 mmol/L 109 107 110  CO2 22 - 32 mmol/L 24 25 24   Calcium 8.9 - 10.3 mg/dL 5.2(C) 8.0(E) 2.3(V)  Total Protein 6.5 - 8.1 g/dL 5.9(L) 5.8(L) 5.8(L)  Total Bilirubin 0.3 - 1.2 mg/dL 2.3(H) 2.7(H) 2.5(H)  Alkaline Phos 38 - 126 U/L 253(H) 248(H) 240(H)  AST 15 - 41 U/L 618(H) 777(H) 916(H)  ALT 0 - 44 U/L 1,090(H) 1,230(H) 1,271(H)   EKG shows sinus rhythm, no ST or QRS abnormalities.  Assessment/Plan:  Principal Problem:   Acute hepatitis Active Problems:   Essential hypertension   Pseudotumor cerebri   Constipation  Acute Hepatitis:Pt's transaminases peaked on 3/11 (AST 1579, ALT 1733) and have steadily improved. On 3/13 am her AST was 777 and ALT was 1230. INR stable at 1.1. No signs of encephalopathy. Viral hepatitis panel was negative. IgG was WNL, ceruloplasmin was WNL, iron studies not indicative of hemochromatosis. Per GI, most likely explanation of abdominal pain and acute hepatitis is a retained gallstone in the CBD which passed before admission. Appreciate GI consultation. -GI recommends additional night of observation in hospital before discharge with close outpatient follow-up including repeat LFTs and EUS. -0.5 mg dilaudid prn q4hrs  -nausea management w/ ondansetronprn -pt was made npo at midnight for potential EUS, which is  now going to be done outpatient. Advance to full diet as tolerated. -monitor LFTs daily -monitor mental status closely  Anaphylaxis: Pt had an episode of anaphylaxis after unknowingly consuming a strawberry on 3/12. She received 0.3 mg IM epinephrine and 25mg  IV benadryl. Her nausea/vomiting, shortness of breath, throat swelling, and rash have resolved. She continues to have hoarseness. -epi pen at bedside -patient has recorded allergies to strawberries and tomatoes.    Chronic Constipation:  s/p partial colectomy, requires regular laxatives. KUB with moderate stool burden.Pt had minimal success with miralax in the past and may require her home supplement. -miralax and sennacot  Dispo: Anticipated discharge in approximately tomorrow  LOS: 2 days   Helayne Seminole, Medical Student 09/17/2018, 11:11 AM Pager: [phone]

## 2018-09-17 NOTE — Progress Notes (Signed)
Theresa Case 10:35 AM  Subjective: Patient doing much better and not short of breath and her belly pain is better and she wants to eat and no new complaints  Objective: Vital signs stable afebrile no acute distress abdomen is softer decreased tenderness normal bowel sounds LFTs trending down except alk phos  Assessment: Questionable CBD stone possibly she had an element of shock liver explaining liver tests  Plan: Okay to try soft diet today and I discussed an outpatient EUS with patient and my partner Theresa Case who will see her tomorrow and she agrees with the plan and if doing well tomorrow and liver tests continue to decrease can hopefully go home with close outpatient follow-up and repeat liver tests on day of EUS  [facility]  Pager [phone] After 5PM or if no answer call [phone]

## 2018-09-18 DIAGNOSIS — T7804XA Anaphylactic reaction due to fruits and vegetables, initial encounter: Secondary | ICD-10-CM

## 2018-09-18 LAB — HEPATIC FUNCTION PANEL
ALT: 804 U/L — ABNORMAL HIGH (ref 0–44)
AST: 211 U/L — ABNORMAL HIGH (ref 15–41)
Albumin: 3.1 g/dL — ABNORMAL LOW (ref 3.5–5.0)
Alkaline Phosphatase: 231 U/L — ABNORMAL HIGH (ref 38–126)
BILIRUBIN TOTAL: 1 mg/dL (ref 0.3–1.2)
Bilirubin, Direct: 0.4 mg/dL — ABNORMAL HIGH (ref 0.0–0.2)
Indirect Bilirubin: 0.6 mg/dL (ref 0.3–0.9)
Total Protein: 6.1 g/dL — ABNORMAL LOW (ref 6.5–8.1)

## 2018-09-18 MED ORDER — EPINEPHRINE 0.3 MG/0.3ML IJ SOAJ: 0.3000 mg | INTRAMUSCULAR | 2 refills | Status: AC | PRN

## 2018-09-18 NOTE — Progress Notes (Signed)
Discharge instructions given to Theresa Case.  Discussed follow up appointments and signs and symptoms to watch for and when to contact the doctor.  Discussed medications, changes and side effects to watch for.  Verbalized understanding.

## 2018-09-18 NOTE — Plan of Care (Signed)
Care plans reviewed and patient is progressing.  

## 2018-09-18 NOTE — Progress Notes (Addendum)
Subjective: No events overnight. Theresa Case is feeling good this morning. Her family visited last night. She has been able to eat without any nausea or vomiting. She has mild, intermittent abdominal pain, much improved from when she came to the ED. Her speaking has improved, and she has no respiratory symptoms. Her rash is gone. She is encouraged that her liver enzymes are improving daily. She is patiently awaiting discharge.  Objective:  Vital signs in last 24 hours: Vitals:   09/17/18 1548 09/17/18 1954 09/18/18 0000 09/18/18 0444  BP: 110/80 111/85 106/73 104/76  Pulse: 75 86 75 71  Resp: 18 20 18 17   Temp: 98 F (36.7 C) 98.3 F (36.8 C) 98.2 F (36.8 C) 98.2 F (36.8 C)  TempSrc: Oral Oral Oral Oral  SpO2: 100% 100% 95% 99%  Weight:      Height:       General: Pt is eating breakfast in bed.  HEENT: darkpigmentedrings noted around both irises. no scleral icterus, no facial edema. No pharyngeal or tongue edema.  Cardiovascular: rrr,no murmurs Respiratory: lungs cta bilaterally. No wheezes or crackles.  Abdominal: mild epigastric and ruq tenderness, improved from 3/13.  Extremities: no edema or rash Neurologic: alert and oriented x3.   CMP Latest Ref Rng & Units 09/18/2018 09/17/2018 09/16/2018  Glucose 70 - 99 mg/dL - 144(Y) 185(U)  BUN 6 - 20 mg/dL - <3(J) 5(L)  Creatinine 0.44 - 1.00 mg/dL - 4.97 0.26  Sodium 378 - 145 mmol/L - 139 139  Potassium 3.5 - 5.1 mmol/L - 3.7 3.6  Chloride 98 - 111 mmol/L - 109 107  CO2 22 - 32 mmol/L - 24 25  Calcium 8.9 - 10.3 mg/dL - 8.5(L) 8.4(L)  Total Protein 6.5 - 8.1 g/dL 6.1(L) 5.9(L) 5.8(L)  Total Bilirubin 0.3 - 1.2 mg/dL 1.0 5.8(I) 2.7(H)  Alkaline Phos 38 - 126 U/L 231(H) 253(H) 248(H)  AST 15 - 41 U/L 211(H) 618(H) 777(H)  ALT 0 - 44 U/L 804(H) 1,090(H) 1,230(H)   CBG: 117  Assessment/Plan:  Principal Problem:   Acute hepatitis Active Problems:   Essential hypertension   Anaphylactic reaction due to food  Acute  Hepatitis:Pt's transaminasespeaked on 3/11 (AST 1579, ALT 1733) and have steadily improved. On 3/13 am her AST was 211 and ALT was 804. No signs of encephalopathy. Patient is improving clinically. Viral hepatitis panel was negative. IgG was WNL, ceruloplasmin was WNL, iron studies not indicative of hemochromatosis. Per GI, most likely explanation of abdominal pain and acute hepatitis is a retained gallstone in the CBD which passed before admission.Appreciate GI consultation. -GI recommends possible discharge today with close outpatient follow-up including repeat LFTs and EUS; Will follow up final recommendations  -0.5 mg dilaudid prn q4hrs  -nausea management w/ ondansetronprn -full diet -monitor mental status closely  Anaphylaxis: Pt had an episode of anaphylaxis after unknowingly consuming a strawberry on 3/12. She received 0.3 mg IM epinephrine and 25mg  IV benadryl. All of her symptoms, including nausea/vomiting, shortness of breath, throat swelling, hoarseness, and rash have resolved. She does not have an epi pen at home -epi pen at bedside -patient will be prescribed epi pen at discharge -patient has recorded allergies to strawberries and tomatoes.   Chronic Constipation: s/p partial colectomy, requires regular laxatives. KUB with moderate stool burden.Pt had minimal success with miralax in the past and may require her home supplement. -miralax and sennacot  Dispo: Anticipated discharge is today  LOS: 3 days   Theresa Case, Medical Student 09/18/2018,  7:33 AM Pager: [phone]  Attestation for Student Documentation:  I personally was present and performed or re-performed the history, physical exam and medical decision-making activities of this service and have verified that the service and findings are accurately documented in the student's note.  Reymundo Poll, MD 09/18/2018, 11:11 AM

## 2018-09-18 NOTE — Discharge Summary (Signed)
Name: Rozalia Dino MRN: [id number] DOB: 08/25/1960 58 y.o. PCP: Windell Hummingbird, PA-C  Date of Admission: 09/14/2018  3:11 AM Date of Discharge: 09/18/2018 Attending Physician: Axel Filler, *  Discharge Diagnosis: 1. Acute Hepatitis 2. Anaphylaxis  Discharge Medications: Allergies as of 09/18/2018      Reactions   Cephalexin Shortness Of Breath   Cephalosporins Shortness Of Breath   Magnesium Shortness Of Breath   PT states increased heart rate and difficulty breathing with IV Mag only.    Magnesium Carbonate Shortness Of Breath   Rapid heart rate    Magnesium Trisilicate Shortness Of Breath   Rapid heart rate  Rapid heart rate    Magnesium-containing Compounds Shortness Of Breath   Rapid heart rate    Strawberry Extract Anaphylaxis, Other (See Comments), Shortness Of Breath   Verapamil Shortness Of Breath, Rash   Aleve [naproxen] Other (See Comments)   Heart race    Codeine Other (See Comments)   Headache  "Very bad headache"   Naproxen Sodium Other (See Comments)   Heart race    Tape Other (See Comments)   Pulls skin off of softer skin (chest area, thigh area, ect)   Tramadol Nausea And Vomiting      Medication List    STOP taking these medications   oxyCODONE 5 MG immediate release tablet Commonly known as:  Oxy IR/ROXICODONE     TAKE these medications   acetaminophen 500 MG tablet Commonly known as:  TYLENOL Take 2 tablets (1,000 mg total) by mouth every 6 (six) hours as needed. What changed:  reasons to take this   aspirin EC 81 MG tablet Take 1 tablet (81 mg total) by mouth daily.   docusate sodium 100 MG capsule Commonly known as:  Colace Take 1 capsule (100 mg total) by mouth daily as needed for mild constipation.   EPINEPHrine 0.3 mg/0.3 mL Soaj injection Commonly known as:  EPI-PEN Inject 0.3 mLs (0.3 mg total) into the muscle as needed (anaphylaxis).   ergocalciferol 1.25 MG (50000 UT) capsule Commonly known as:  VITAMIN  D2 Take 50,000 Units by mouth once a week.   hydrochlorothiazide 25 MG tablet Commonly known as:  HYDRODIURIL Take 25 mg by mouth daily.   multivitamin with minerals tablet Take 1 tablet by mouth daily.   ondansetron 4 MG tablet Commonly known as:  ZOFRAN Take 1 tablet (4 mg total) by mouth every 6 (six) hours.       Disposition and follow-up:   Ms.Audrena Krieger was discharged from [facility] in Stable condition.  At the hospital follow up visit please address:  1.  Acute Hepatitis: Admitted with LFTs > 1,000, improved with supportive care. Suspected to be secondary to retained / passed common bile duct stone after recent cholecystectomy. Work up otherwise negative.   2.  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 Unfortunately experienced anaphylaxis during this hospitalization after strawberry exposure, accidentally included in her fruit cup. Improved with IM epi x 1. Discharged with an Epi pen.   3.  Labs / imaging needed at time of follow-up: repeat liver function panel, endoscopic ultrasound  4.  Pending labs/ test needing follow-up: none  Follow-up Appointments: Follow-up Information    Windell Hummingbird, PA-C. Schedule an appointment as soon as possible for a visit in 1 week(s).   Specialty:  Physician Assistant Contact information: [address] [place] [address] [phone]  Fay Records, MD .   Specialty:  Cardiology Contact information: [address] [place] [address] [phone]        Clarene Essex, MD. Schedule an appointment as soon as possible for a visit in 2 week(s).   Specialty:  Gastroenterology Contact information: [address] [place] [address] [phone]           Follow-up with your primary care provider within 1-2 weeks of discharge Follow-up with Dr. Watt Climes at [place] [facility] for outpatient endoscopic ultrasound  to evaluate your liver  Hospital Course by problem list:  1. Acute Hepatitis: On 3/10, Mrs. Bonito presented to the ED after 5 hours of acute onset RUQ and epigastric abdominal pain. She woke up to the pain at 2am. It felt very similar to the pain she had before her cholecystectomy 7 weeks ago. She had associated sweating, nausea and vomiting, and had been unable to tolerate any solids or liquids today. She denied fever, chills, diarrhea, headache or urinary symptoms. Mrs. Stiner had a cholecystectomy 7 weeks ago after being diagnosed with cholecystitis. She had gradually decreasing abdominal pain for 6 weeks, with resolution on 3/3. Of note, Mrs. Lopata also has an extensive history of constipation, for which she required partial colectomy in the past. Abdominal xray showed moderate stool burden with no signs of perforation or obstruction. MRCP on 3/10 showed no signs of bile duct dilation, stones, or filling defects, and no focal liver lesions. On 3/11 CMP was significant for AST of of 1728, ALT of 1605, total bilirubin of 2.7, and alk phos of 218. INR was 1.0 and she was not encephalopathic. Upon interview, Mrs. Stroope endorsed a history of fatty liver disease. She drinks less than one alcoholic drink per month. Workup for hemochromatosis, Wilson's disease, viral hepatitis, auto-immune hepatitis, and tylenol-induced hepatitis were negative. She had no history of hypotensive events. Her AST and ALT began to improve overnight on 3/11. She had steady clinical improvement and improvement of her liver studies. She was advanced to full diet on 3/12 and tolerated it well. She is being discharged with a scheduled outpatient endoscopic ultrasound for continued workup.  2. Anaphylaxis: On the evening of 3/12, Mrs. Faidley had an anaphylactic reaction to a strawberry in her fruit cup. She did not see the strawberry and consumed other fruit contaminated with strawberry juice. She had not ordered a fruit cup and her  allergy was in the medical records. She was found leaning over the side of the bed, dry-heaving and gasping for air. She felt that her throat was closing and her lips were tingling and swelling. On exam she had some mild upper airway stridor, lung were clear, with mild swelling of the posterior oropharynx and lips. She had early development of hives on her back and arms, with intense pruritus. She was given epinephrine 0.3 mg IM x 1 with immediate improvement in her breathing.   Her hives improved with 25 mg Benadryl IV. She was transferred to the Progressive unit for close monitoring. She had no delayed reaction. Her only persistent symptom was hoarseness, which resolved on 3/14. 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 Her last allergic reaction was years ago. She also reported a mild allergy to tomatoes with some lip tingling and swelling but no anaphylaxis. She has multiple medication allergies (cephalosporins, magnesium, and verapamil) but no other food allergies that she is aware of. She was discharged with an EpiPen.  Discharge Vitals:   BP 113/79 (BP Location: Left Arm)    Pulse 72    Temp 98.2 F (36.8 C) (Oral)    Resp 18    Ht 5' 7" (1.702 m)    Wt 102.5 kg    LMP  (Exact Date)    SpO2 99%    BMI 35.40 kg/m   Pertinent Labs, Studies, and Procedures:  CBC Latest Ref Rng & Units 09/16/2018 09/15/2018 09/14/2018  WBC 4.0 - 10.5 K/uL 3.1(L) 2.5(L) 5.0  Hemoglobin 12.0 - 15.0 g/dL 11.9(L) 11.7(L) 12.6  Hematocrit 36.0 - 46.0 % 36.4 36.3 38.3  Platelets 150 - 400 K/uL 265 263 291   CMP Latest Ref Rng & Units 09/18/2018 09/17/2018 09/16/2018  Glucose 70 - 99 mg/dL - 117(H) 102(H)  BUN 6 - 20 mg/dL - <5(L) 5(L)  Creatinine 0.44 - 1.00 mg/dL - 0.76 0.80  Sodium 135 - 145 mmol/L - 139 139  Potassium 3.5 - 5.1 mmol/L - 3.7 3.6  Chloride 98 - 111 mmol/L - 109 107  CO2 22 - 32 mmol/L - 24 25  Calcium 8.9 - 10.3 mg/dL - 8.5(L) 8.4(L)  Total Protein 6.5 - 8.1 g/dL 6.1(L)  5.9(L) 5.8(L)  Total Bilirubin 0.3 - 1.2 mg/dL 1.0 2.3(H) 2.7(H)  Alkaline Phos 38 - 126 U/L 231(H) 253(H) 248(H)  AST 15 - 41 U/L 211(H) 618(H) 777(H)  ALT 0 - 44 U/L 804(H) 1,090(H) 1,230(H)   CMP Latest Ref Rng & Units 09/15/2018 09/15/2018 09/14/2018  Glucose 70 - 99 mg/dL 90 99 138(H)  BUN 6 - 20 mg/dL _0 Creatinine 0.44 - 1.00 mg/dL 0.73 0.77 0.83  Sodium 135 - 145 mmol/L 140 141 140  Potassium 3.5 - 5.1 mmol/L 3.7 3.7 2.9(L)  Chloride 98 - 111 mmol/L 107 109 108  CO2 22 - 32 mmol/L _1 Calcium 8.9 - 10.3 mg/dL 8.8(L) 8.7(L) 9.0  Total Protein 6.5 - 8.1 g/dL 6.6 6.0(L) 6.5  Total Bilirubin 0.3 - 1.2 mg/dL 2.8(H) 2.7(H) 0.5  Alkaline Phos 38 - 126 U/L 261(H) 218(H) 84  AST 15 - 41 U/L 1,579(H) 1,728(H) 116(H)  ALT 0 - 44 U/L 1,733(H) 1,605(H) 68(H)   Lipase 21 (3/10) Ferritin 717 (3/11) INR 1.0 (3/11) 1.1 (3/12) CK 209 (3/11) Iron 114 (3/12) TIBC 263 (3/12) Transferrin 188 (3/12) Ceruloplasmin 32.4 (3/12) IgG 1,009 (3/12) Hep A IgM Negative (3/12) HbSAb Negative (3/12) HbCAb Negative (3/12) Hep B-Post 373 (3/12) HCV Ab <0.1 (3/12)  Dg Abd 1 View  Result Date: 09/14/2018 CLINICAL DATA:  Constipation. Nausea and vomiting. Recent cholecystectomy. EXAM: ABDOMEN - 1 VIEW COMPARISON:  CT earlier same day. FINDINGS: Clips in the right upper quadrant consistent with previous cholecystectomy. Moderate amount of stool in the right colon. Moderate amount of gas in the transverse and left colon. Small bowel pattern is normal. Anastomotic sutures in the sigmoid region. No abnormal calcifications are seen. Contrast is present in the urinary bladder following previous CT. IMPRESSION: No acute or likely significant finding. Electronically Signed   By: Nelson Chimes M.D.   On: 09/14/2018 16:28   Mr Abdomen Mrcp W Wo Contast  Result Date: 09/14/2018 CLINICAL DATA:  Cholecystectomy in January. This morning developed severe right upper quadrant pain. Abnormal liver function test.  EXAM: MRI ABDOMEN WITHOUT AND WITH CONTRAST (INCLUDING MRCP) TECHNIQUE: Multiplanar multisequence MR imaging of the abdomen was performed both before and after the administration of intravenous contrast. Heavily T2-weighted images of the biliary and pancreatic ducts  were obtained, and three-dimensional MRCP images were rendered by post processing. CONTRAST:  10 mL Gadavist COMPARISON:  CT abdomen and pelvis 09/14/2018 FINDINGS: Motion artifact limits examination. Lower chest: Mild dependent changes in the lung bases. Hepatobiliary: Gallbladder is surgically absent. Artifact from surgical clips demonstrated in the gallbladder fossa and anterior abdominal wall. There is no bile duct dilatation and no bile duct stones or filling defects are identified. No focal liver lesions are identified. Pancreas: No mass, inflammatory changes, or other parenchymal abnormality identified. Spleen:  Within normal limits in size and appearance. Adrenals/Urinary Tract: No adrenal gland nodules. Subcentimeter parenchymal cysts in the kidneys. Nephrograms are symmetrical and homogeneous otherwise. No hydronephrosis. Stomach/Bowel: Visualized portions within the abdomen are unremarkable. Vascular/Lymphatic: No pathologically enlarged lymph nodes identified. No abdominal aortic aneurysm demonstrated. Other:  No free fluid is identified in the abdomen. Musculoskeletal: No suspicious bone lesions identified. IMPRESSION: Surgical absence of the gallbladder. No bile duct dilatation, stone, or filling defect. No focal liver lesions. Electronically Signed   By: Lucienne Capers M.D.   On: 09/14/2018 19:43   Discharge Instructions: Discharge Instructions    Call MD for:  persistant nausea and vomiting   Complete by:  As directed    Call MD for:  severe uncontrolled pain   Complete by:  As directed    Call MD for:  temperature >100.4   Complete by:  As directed    Diet - low sodium heart healthy   Complete by:  As directed    Discharge  instructions   Complete by:  As directed    Mrs. Weaver,   It was a pleasure to participate in your care during this hospitalization. I am encouraged that your abdominal pain is improving, your appetite is back, and that your liver enzymes show signs of recovery. While we do not know exactly what caused your abdominal pain, we are most suspicious of a passed gallstone, and have ruled out many other possibilities. I am deeply sorry for the anaphylactic reaction you suffered while in the hospital, and I am glad that you recovered so quickly.   As you return home, I would encourage you to follow-up quickly with Dr. Watt Climes for an endoscopic ultrasound of your liver. I would also encourage following-up with your primary care provider within 1-2 weeks. Be in contact with them sooner if you have increasing abdominal pain, cannot eat, or begin to notice signs of confusion. Continue the diet your surgeon prescribed you 8 weeks ago, and avoid tylenol and alcohol as they can both irritate the liver. I hope that you continue to recover quickly and that you can enjoy some time at home.  Sincerely,  Adline Potter   Increase activity slowly   Complete by:  As directed       Signed: Velna Ochs, MD 09/18/2018, 2:33 PM   Pager: [phone]

## 2018-09-18 NOTE — Plan of Care (Signed)
Theresa Case is adequate for discharge.

## 2019-08-16 ENCOUNTER — Ambulatory Visit: Payer: Managed Care, Other (non HMO) | Admitting: Orthopaedic Surgery

## 2019-08-16 ENCOUNTER — Encounter: Payer: Self-pay | Admitting: Orthopaedic Surgery

## 2019-08-16 ENCOUNTER — Other Ambulatory Visit: Payer: Self-pay

## 2019-08-16 DIAGNOSIS — M65312 Trigger thumb, left thumb: Secondary | ICD-10-CM | POA: Insufficient documentation

## 2019-08-16 DIAGNOSIS — M65342 Trigger finger, left ring finger: Secondary | ICD-10-CM | POA: Insufficient documentation

## 2019-08-16 DIAGNOSIS — M65332 Trigger finger, left middle finger: Secondary | ICD-10-CM | POA: Diagnosis not present

## 2019-08-16 NOTE — Progress Notes (Signed)
Office Visit Note   Patient: Theresa Case           Date of Birth: 1960/10/11           MRN: [id number] Visit Date: 08/16/2019              Requested by: Windell Hummingbird, PA-C [address] [place],  [place] [address] PCP: Windell Hummingbird, PA-C   Assessment & Plan: Visit Diagnoses:  1. Trigger thumb, left thumb   2. Trigger finger, left middle finger     Plan: My impression is left thumb and long trigger finger with temporary relief from 2 prior cortisone injections in each finger.  Based on discussion and likelihood of recurrent trigger finger she has elected to proceed with trigger finger release of the left thumb and long finger.  She is not interested in having another injection since the previous one was so painful.  We will schedule her surgery for the near future.  Risk benefits alternatives were reviewed with the patient today.  Follow-Up Instructions: Return for 1 week postop visit.   Orders:  No orders of the defined types were placed in this encounter.  No orders of the defined types were placed in this encounter.     Procedures: No procedures performed   Clinical Data: No additional findings.   Subjective: Chief Complaint  Patient presents with  . Left Thumb - Pain  . Left Middle Finger - Pain    Theresa Case is a 58 year old female who is a daughter of understanding who I recently took care of for shoulder problem who comes in for evaluation of a left thumb and long trigger finger since May of last year.  She originally had an injection in each finger at [facility] pain clinic which she states helped significantly for the following months but then received another cortisone injection in November by Dr. Linton Rump down [facility].  She states that the most recent injection did not help at all and caused her to have more pain.  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  She is not able to open jars and she has burning and  swelling.   Review of Systems  Constitutional: Negative.   HENT: Negative.   Eyes: Negative.   Respiratory: Negative.   Cardiovascular: Negative.   Endocrine: Negative.   Musculoskeletal: Negative.   Neurological: Negative.   Hematological: Negative.   Psychiatric/Behavioral: Negative.   All other systems reviewed and are negative.    Objective: Vital Signs: There were no vitals taken for this visit.  Physical Exam Vitals and nursing note reviewed.  Constitutional:      Appearance: She is well-developed.  HENT:     Head: Normocephalic and atraumatic.  Pulmonary:     Effort: Pulmonary effort is normal.  Abdominal:     Palpations: Abdomen is soft.  Musculoskeletal:     Cervical back: Neck supple.  Skin:    General: Skin is warm.     Capillary Refill: Capillary refill takes less than 2 seconds.  Neurological:     Mental Status: She is alert and oriented to person, place, and time.  Psychiatric:        Behavior: Behavior normal.        Thought Content: Thought content normal.        Judgment: Judgment normal.     Ortho Exam Left thumb and long finger exam shows tenderness in the palm overlying the A1 pulley.  The digits are neurovascularly intact.  She has limited range of motion secondary to guarding and pain. Specialty Comments:  No specialty comments available.  Imaging: No results found.   PMFS History: Patient Active Problem List   Diagnosis Date Noted  . Trigger thumb, left thumb 08/16/2019  . Trigger finger, left middle finger 08/16/2019  . Anaphylactic reaction due to food 09/17/2018  . Acute hepatitis 09/14/2018  . Essential hypertension 09/14/2018  . Pseudotumor cerebri 09/14/2018  . Hypothyroidism 12/10/2014  . Obesity, Class II, BMI 35-39.9, with comorbidity 12/10/2014  . OSA (obstructive sleep apnea) 12/10/2014   Past Medical History:  Diagnosis Date  . Acute hepatitis 09/2018  . Arthritis    "left knee" (07/19/2018)  . GERD  (gastroesophageal reflux disease)    hx (07/19/2018)  . Headache    "q 4 months or so; from my pseudotumor" (07/19/2018)  . Heart murmur    "grew out of it" (07/19/2018)  . History of kidney stones   . Hypertension   . Pneumonia    "once" (07/19/2018)  . PONV (postoperative nausea and vomiting)   . Pseudotumor cerebri     Family History  Problem Relation Age of Onset  . Hypertension Mother   . Diabetes Mother   . Cancer Father   . Hyperlipidemia Brother     Past Surgical History:  Procedure Laterality Date  . ABDOMINAL HYSTERECTOMY  1999  . BAND HEMORRHOIDECTOMY  2019  . 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 CHOLECYSTECTOMY N/A 07/20/2018   Procedure: LAPAROSCOPIC CHOLECYSTECTOMY WITH INTRAOPERATIVE CHOLANGIOGRAM;  Surgeon: Manus Rudd, MD;  Location: [facility] OR;  Service: General;  Laterality: N/A;  . COLECTOMY Left ~ 2010   2.5 feet  . CYST EXCISION Left 1988   "bleeding cyst in palm of my hand" (07/19/2018)  . CYST EXCISION Left 1969   "behind my ear"  . TUBAL LIGATION  1994   Social History   Occupational History  . Not on file  Tobacco Use  . Smoking status: Former Smoker    Packs/day: 0.50    Years: 10.00    Pack years: 5.00    Types: Cigarettes    Quit date: 1990    Years since quitting: 31.1  . Smokeless tobacco: Never Used  Substance and Sexual Activity  . Alcohol use: Not Currently  . Drug use: Never  . Sexual activity: Not Currently

## 2019-08-24 ENCOUNTER — Other Ambulatory Visit: Payer: Self-pay | Admitting: Physician Assistant

## 2019-08-24 MED ORDER — ONDANSETRON HCL 4 MG PO TABS: 4.0000 mg | ORAL_TABLET | Freq: Three times a day (TID) | ORAL | 0 refills | Status: AC | PRN

## 2019-08-24 MED ORDER — HYDROCODONE-ACETAMINOPHEN 5-325 MG PO TABS: 1.0000 | ORAL_TABLET | Freq: Three times a day (TID) | ORAL | 0 refills | Status: AC | PRN

## 2019-08-31 ENCOUNTER — Other Ambulatory Visit: Payer: Self-pay | Admitting: Physician Assistant

## 2019-08-31 ENCOUNTER — Telehealth: Payer: Self-pay | Admitting: Orthopaedic Surgery

## 2019-08-31 NOTE — Telephone Encounter (Signed)
Received call from Jeannett Senior with the [facility] needing to know if the patient had surgery and the date of the surgery. The number to contact Jeannett Senior is [phone]

## 2019-09-01 ENCOUNTER — Ambulatory Visit: Payer: Managed Care, Other (non HMO) | Admitting: Orthopaedic Surgery

## 2019-09-01 ENCOUNTER — Encounter: Payer: Self-pay | Admitting: Orthopaedic Surgery

## 2019-09-01 DIAGNOSIS — M65319 Trigger thumb, unspecified thumb: Secondary | ICD-10-CM | POA: Insufficient documentation

## 2019-09-01 DIAGNOSIS — M65312 Trigger thumb, left thumb: Secondary | ICD-10-CM

## 2019-09-01 DIAGNOSIS — M65332 Trigger finger, left middle finger: Secondary | ICD-10-CM

## 2019-09-01 NOTE — Telephone Encounter (Signed)
Incorrect number. Number not in service.

## 2019-09-08 ENCOUNTER — Other Ambulatory Visit: Payer: Self-pay

## 2019-09-08 ENCOUNTER — Ambulatory Visit (INDEPENDENT_AMBULATORY_CARE_PROVIDER_SITE_OTHER): Payer: Managed Care, Other (non HMO) | Admitting: Physician Assistant

## 2019-09-08 ENCOUNTER — Encounter: Payer: Self-pay | Admitting: Physician Assistant

## 2019-09-08 DIAGNOSIS — M65312 Trigger thumb, left thumb: Secondary | ICD-10-CM

## 2019-09-08 DIAGNOSIS — M65332 Trigger finger, left middle finger: Secondary | ICD-10-CM

## 2019-09-08 NOTE — Progress Notes (Signed)
Post-Op Visit Note   Patient: Theresa Case           Date of Birth: December 10, 1960           MRN: [id number] Visit Date: 09/08/2019 PCP: Windell Hummingbird, PA-C   Assessment & Plan:  Chief Complaint:  Chief Complaint  Patient presents with  . Left Thumb - Pain, Follow-up  . Left Middle Finger - Pain, Follow-up   Visit Diagnoses:  1. Trigger thumb, left thumb   2. Trigger finger, left middle finger     Plan: Patient is a pleasant 59 year old female who comes in today 1 week out left hand thumb and long finger trigger finger release.  She has been doing okay.  No fevers or chills.  She has not been taking any narcotic pain medication.  She does have pain when trying to extend her long finger.  Examination of her left hand reveals well-healing surgical incisions without evidence of infection.  There are nylon sutures in place.  Fingers are warm and well-perfused.  She is neurovascular intact distally.  Today, the wounds were cleaned and covered with Band-Aids.  Removable thumb spica splint applied.  She will avoid any submerging her hand in water or heavy lifting for another 3 weeks.  Follow-up with [place] in 1 week's time for repeat evaluation and probable suture removal.  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  Follow-Up Instructions: Return in about 1 week (around 09/15/2019).   Orders:  No orders of the defined types were placed in this encounter.  No orders of the defined types were placed in this encounter.   Imaging: Imaging  PMFS History: Patient Active Problem List   Diagnosis Date Noted  . Stenosing tenosynovitis of thumb 09/01/2019  . Trigger thumb, left thumb 08/16/2019  . Trigger finger, left middle finger 08/16/2019  . Anaphylactic reaction due to food 09/17/2018  . Acute hepatitis 09/14/2018  . Essential hypertension 09/14/2018  . Pseudotumor cerebri 09/14/2018  . Hypothyroidism 12/10/2014  . Obesity, Class II, BMI 35-39.9, with  comorbidity 12/10/2014  . OSA (obstructive sleep apnea) 12/10/2014   Past Medical History:  Diagnosis Date  . Acute hepatitis 09/2018  . Arthritis    "left knee" (07/19/2018)  . GERD (gastroesophageal reflux disease)    hx (07/19/2018)  . Headache    "q 4 months or so; from my pseudotumor" (07/19/2018)  . Heart murmur    "grew out of it" (07/19/2018)  . History of kidney stones   . Hypertension   . Pneumonia    "once" (07/19/2018)  . PONV (postoperative nausea and vomiting)   . Pseudotumor cerebri     Family History  Problem Relation Age of Onset  . Hypertension Mother   . Diabetes Mother   . Cancer Father   . Hyperlipidemia Brother     Past Surgical History:  Procedure Laterality Date  . ABDOMINAL HYSTERECTOMY  1999  . BAND HEMORRHOIDECTOMY  2019  . 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 CHOLECYSTECTOMY N/A 07/20/2018   Procedure: LAPAROSCOPIC CHOLECYSTECTOMY WITH INTRAOPERATIVE CHOLANGIOGRAM;  Surgeon: Donnie Mesa, MD;  Location: [facility];  Service: General;  Laterality: N/A;  . COLECTOMY Left ~ 2010   2.5 feet  . CYST EXCISION Left 1988   "bleeding cyst in palm of my hand" (07/19/2018)  . CYST EXCISION Left 1969   "behind my ear"  . [place]  History   Occupational History  . Not on file  Tobacco Use  . Smoking status: Former Smoker    Packs/day: 0.50    Years: 10.00    Pack years: 5.00    Types: Cigarettes    Quit date: 1990    Years since quitting: 31.1  . Smokeless tobacco: Never Used  Substance and Sexual Activity  . Alcohol use: Not Currently  . Drug use: Never  . Sexual activity: Not Currently

## 2019-09-15 ENCOUNTER — Ambulatory Visit (INDEPENDENT_AMBULATORY_CARE_PROVIDER_SITE_OTHER): Payer: Managed Care, Other (non HMO) | Admitting: Orthopaedic Surgery

## 2019-09-15 ENCOUNTER — Telehealth: Payer: Self-pay

## 2019-09-15 ENCOUNTER — Other Ambulatory Visit: Payer: Self-pay

## 2019-09-15 ENCOUNTER — Encounter: Payer: Self-pay | Admitting: Orthopaedic Surgery

## 2019-09-15 DIAGNOSIS — M65312 Trigger thumb, left thumb: Secondary | ICD-10-CM

## 2019-09-15 DIAGNOSIS — M65332 Trigger finger, left middle finger: Secondary | ICD-10-CM

## 2019-09-15 NOTE — Progress Notes (Signed)
Post-Op Visit Note   Patient: Theresa Case           Date of Birth: 1960-12-28           MRN: [id number] Visit Date: 09/15/2019 PCP: Darryl Lent, PA-C   Assessment & Plan:  Chief Complaint:  Chief Complaint  Patient presents with  . Left Thumb - Follow-up   Visit Diagnoses:  1. Trigger thumb, left thumb   2. Trigger finger, left middle finger     Plan: Patient is 2 weeks status post left thumb and long finger trigger finger releases.  She is doing well overall.  She has some mild swelling.  No significant pain.  Not taking any pain medications.  Surgical incisions are healed and the sutures are intact.  Neurovascular intact.  She has some moderate limitation in range of motion secondary to discomfort.  We remove the sutures today.  She was offered formal outpatient hand therapy but she would like to just do this on her own.  I would like to recheck her in 4 weeks.  Questions encouraged and answered.  Follow-Up Instructions: Return in about 4 weeks (around 10/13/2019).   Orders:  No orders of the defined types were placed in this encounter.  No orders of the defined types were placed in this encounter.   Imaging: No results found.  PMFS History: Patient Active Problem List   Diagnosis Date Noted  . Stenosing tenosynovitis of thumb 09/01/2019  . Trigger thumb, left thumb 08/16/2019  . Trigger finger, left middle finger 08/16/2019  . Anaphylactic reaction due to food 09/17/2018  . Acute hepatitis 09/14/2018  . Essential hypertension 09/14/2018  . Pseudotumor cerebri 09/14/2018  . Hypothyroidism 12/10/2014  . Obesity, Class II, BMI 35-39.9, with comorbidity 12/10/2014  . OSA (obstructive sleep apnea) 12/10/2014   Past Medical History:  Diagnosis Date  . Acute hepatitis 09/2018  . Arthritis    "left knee" (07/19/2018)  . GERD (gastroesophageal reflux disease)    hx (07/19/2018)  . Headache    "q 4 months or so; from my pseudotumor" (07/19/2018)  . Heart murmur      "grew out of it" (07/19/2018)  . History of kidney stones   . Hypertension   . Pneumonia    "once" (07/19/2018)  . PONV (postoperative nausea and vomiting)   . Pseudotumor cerebri     Family History  Problem Relation Age of Onset  . Hypertension Mother   . Diabetes Mother   . Cancer Father   . Hyperlipidemia Brother     Past Surgical History:  Procedure Laterality Date  . ABDOMINAL HYSTERECTOMY  1999  . BAND HEMORRHOIDECTOMY  2019  . 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 CHOLECYSTECTOMY N/A 07/20/2018   Procedure: LAPAROSCOPIC CHOLECYSTECTOMY WITH INTRAOPERATIVE CHOLANGIOGRAM;  Surgeon: Manus Rudd, MD;  Location: [facility];  Service: General;  Laterality: N/A;  . COLECTOMY Left ~ 2010   2.5 feet  . CYST EXCISION Left 1988   "bleeding cyst in palm of my hand" (07/19/2018)  . CYST EXCISION Left 1969   "behind my ear"  . TUBAL LIGATION  1994   Social History   Occupational History  . Not on file  Tobacco Use  . Smoking status: Former Smoker    Packs/day: 0.50    Years: 10.00    Pack years: 5.00    Types: Cigarettes    Quit date: 1990    Years since quitting: 31.2  .  Smokeless tobacco: Never Used  Substance and Sexual Activity  . Alcohol use: Not Currently  . Drug use: Never  . Sexual activity: Not Currently

## 2019-09-15 NOTE — Telephone Encounter (Signed)
Printed today's OV note for patient.

## 2019-10-13 ENCOUNTER — Encounter: Payer: Self-pay | Admitting: Orthopaedic Surgery

## 2019-10-13 ENCOUNTER — Other Ambulatory Visit: Payer: Self-pay

## 2019-10-13 ENCOUNTER — Ambulatory Visit (INDEPENDENT_AMBULATORY_CARE_PROVIDER_SITE_OTHER): Payer: Managed Care, Other (non HMO) | Admitting: Orthopaedic Surgery

## 2019-10-13 DIAGNOSIS — M65312 Trigger thumb, left thumb: Secondary | ICD-10-CM

## 2019-10-13 DIAGNOSIS — M65332 Trigger finger, left middle finger: Secondary | ICD-10-CM

## 2019-10-13 MED ORDER — METHYLPREDNISOLONE 4 MG PO TBPK: ORAL_TABLET | ORAL | 0 refills | Status: AC

## 2019-10-13 NOTE — Progress Notes (Signed)
Post-Op Visit Note   Patient: Theresa Case           Date of Birth: Sep 30, 1960           MRN: [id number] Visit Date: 10/13/2019 PCP: Darryl Lent, PA-C   Assessment & Plan:  Chief Complaint:  Chief Complaint  Patient presents with  . Left Thumb - Pain  . Left Index Finger - Pain   Visit Diagnoses:  1. Trigger thumb, left thumb   2. Trigger finger, left middle finger     Plan: Theresa Case returns today for follow-up of her recent left thumb and long finger trigger releases.  She is doing excellent with her thumb but she feels some residuals soreness and tightness with the long finger.  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  Overall she is doing well.  Thumb spica brace did not help her wrist.  She feels a little soreness and stiffness with increased activity on the ulnar side of the wrist while at work.  Prescribed short course of Medrol Dosepak for this.  I think that it may be ECU tendinitis.  From a trigger finger standpoint she is doing well.  She can continue to increase activity and use as tolerated.  We will see her back as needed.  Follow-Up Instructions: Return if symptoms worsen or fail to improve.   Orders:  No orders of the defined types were placed in this encounter.  Meds ordered this encounter  Medications  . methylPREDNISolone (MEDROL DOSEPAK) 4 MG TBPK tablet    Sig: Use as directed    Dispense:  21 tablet    Refill:  0    Imaging: No results found.  PMFS History: Patient Active Problem List   Diagnosis Date Noted  . Stenosing tenosynovitis of thumb 09/01/2019  . Trigger thumb, left thumb 08/16/2019  . Trigger finger, left middle finger 08/16/2019  . Anaphylactic reaction due to food 09/17/2018  . Acute hepatitis 09/14/2018  . Essential hypertension 09/14/2018  . Pseudotumor cerebri 09/14/2018  . Hypothyroidism 12/10/2014  . Obesity, Class II, BMI 35-39.9, with comorbidity 12/10/2014  . OSA (obstructive sleep apnea) 12/10/2014   Past  Medical History:  Diagnosis Date  . Acute hepatitis 09/2018  . Arthritis    "left knee" (07/19/2018)  . GERD (gastroesophageal reflux disease)    hx (07/19/2018)  . Headache    "q 4 months or so; from my pseudotumor" (07/19/2018)  . Heart murmur    "grew out of it" (07/19/2018)  . History of kidney stones   . Hypertension   . Pneumonia    "once" (07/19/2018)  . PONV (postoperative nausea and vomiting)   . Pseudotumor cerebri     Family History  Problem Relation Age of Onset  . Hypertension Mother   . Diabetes Mother   . Cancer Father   . Hyperlipidemia Brother     Past Surgical History:  Procedure Laterality Date  . ABDOMINAL HYSTERECTOMY  1999  . BAND HEMORRHOIDECTOMY  2019  . 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 CHOLECYSTECTOMY N/A 07/20/2018   Procedure: LAPAROSCOPIC CHOLECYSTECTOMY WITH INTRAOPERATIVE CHOLANGIOGRAM;  Surgeon: Manus Rudd, MD;  Location: [facility];  Service: General;  Laterality: N/A;  . COLECTOMY Left ~ 2010   2.5 feet  . CYST EXCISION Left 1988   "bleeding cyst in palm of my hand" (07/19/2018)  . CYST EXCISION Left 1969   "behind my ear"  . TUBAL LIGATION  1994   Social History   Occupational History  . Not on file  Tobacco Use  . Smoking status: Former Smoker    Packs/day: 0.50    Years: 10.00    Pack years: 5.00    Types: Cigarettes    Quit date: 1990    Years since quitting: 31.2  . Smokeless tobacco: Never Used  Substance and Sexual Activity  . Alcohol use: Not Currently  . Drug use: Never  . Sexual activity: Not Currently

## 2020-01-05 ENCOUNTER — Ambulatory Visit: Payer: Managed Care, Other (non HMO) | Admitting: Orthopaedic Surgery

## 2020-01-05 ENCOUNTER — Encounter: Payer: Self-pay | Admitting: Orthopaedic Surgery

## 2020-01-05 DIAGNOSIS — M65342 Trigger finger, left ring finger: Secondary | ICD-10-CM | POA: Diagnosis not present

## 2020-01-05 DIAGNOSIS — M65352 Trigger finger, left little finger: Secondary | ICD-10-CM | POA: Diagnosis not present

## 2020-01-05 NOTE — Progress Notes (Signed)
Office Visit Note   Patient: Theresa Case           Date of Birth: [birth year]           MRN: [id number] Visit Date: 01/05/2020              Requested by: Darryl Lent, PA-C [address] [place],  [place] [address] PCP: Darryl Lent, PA-C   Assessment & Plan: Visit Diagnoses:  1. Trigger finger, left ring finger   2. Trigger little finger of left hand     Plan: Impression is ongoing left hand and wrist pain.  We will need to obtain an MR arthrogram of the left wrist to rule out structural abnormalities due to lack of improvement from conservative treatment.  For the left hand I feel that what she is reporting is consistent with osteoarthritic arthritis.  We will make a referral to rheumatology for medical management.  We will see her back after the MRI.  Follow-Up Instructions: Return if symptoms worsen or fail to improve.   Orders:  No orders of the defined types were placed in this encounter.  No orders of the defined types were placed in this encounter.     Procedures: No procedures performed   Clinical Data: No additional findings.   Subjective: Chief Complaint  Patient presents with   Left Hand - Pain    Left ring finger/left little finger    Ms. Theresa Case returns today for continued left hand and wrist pain.  She feels like she may be developing trigger fingers in the left ring and small fingers.  She also has continued left wrist pain.  She has overall done well from trigger thumb and long trigger finger releases.   Review of Systems  Constitutional: Negative.   HENT: Negative.   Eyes: Negative.   Respiratory: Negative.   Cardiovascular: Negative.   Endocrine: Negative.   Musculoskeletal: Negative.   Neurological: Negative.   Hematological: Negative.   Psychiatric/Behavioral: Negative.   All other systems reviewed and are negative.    Objective: Vital Signs: There were no vitals taken for this visit.  Physical Exam Vitals and nursing  note reviewed.  Constitutional:      Appearance: She is well-developed.  Pulmonary:     Effort: Pulmonary effort is normal.  Skin:    General: Skin is warm.     Capillary Refill: Capillary refill takes less than 2 seconds.  Neurological:     Mental Status: She is alert and oriented to person, place, and time.  Psychiatric:        Behavior: Behavior normal.        Thought Content: Thought content normal.        Judgment: Judgment normal.     Ortho Exam Left hand shows painful palpation to her IP and MP joints.  No significant swelling.  No focal findings.  I am not able to elicit any triggering of the ring or small finger. Specialty Comments:  No specialty comments available.  Imaging: No results found.   PMFS History: Patient Active Problem List   Diagnosis Date Noted   Trigger little finger of left hand 01/05/2020   Stenosing tenosynovitis of thumb 09/01/2019   Trigger thumb, left thumb 08/16/2019   Trigger finger, left ring finger 08/16/2019   Anaphylactic reaction due to food 09/17/2018   Acute hepatitis 09/14/2018   Essential hypertension 09/14/2018   Pseudotumor cerebri 09/14/2018   Hypothyroidism 12/10/2014   Obesity, Class II, BMI 35-39.9, with  comorbidity 12/10/2014   OSA (obstructive sleep apnea) 12/10/2014   Past Medical History:  Diagnosis Date   Acute hepatitis 09/2018   Arthritis    "left knee" (07/19/2018)   GERD (gastroesophageal reflux disease)    hx (07/19/2018)   Headache    "q 4 months or so; from my pseudotumor" (07/19/2018)   Heart murmur    "grew out of it" (07/19/2018)   History of kidney stones    Hypertension    Pneumonia    "once" (07/19/2018)   PONV (postoperative nausea and vomiting)    Pseudotumor cerebri     Family History  Problem Relation Age of Onset   Hypertension Mother    Diabetes Mother    Cancer Father    Hyperlipidemia Brother     Past Surgical History:  Procedure Laterality Date    ABDOMINAL HYSTERECTOMY  1999   BAND HEMORRHOIDECTOMY  2019   BRAIN SURGERY  2014; 2016; 2017   "pseudo tumor; put stents in" (07/19/2018)   CHOLECYSTECTOMY N/A 07/20/2018   Procedure: LAPAROSCOPIC CHOLECYSTECTOMY WITH INTRAOPERATIVE CHOLANGIOGRAM;  Surgeon: Manus Rudd, MD;  Location: [facility];  Service: General;  Laterality: N/A;   COLECTOMY Left ~ 2010   2.5 feet   CYST EXCISION Left 1988   "bleeding cyst in palm of my hand" (07/19/2018)   CYST EXCISION Left 1969   "behind my ear"   TUBAL LIGATION  1994   Social History   Occupational History   Not on file  Tobacco Use   Smoking status: Former Smoker    Packs/day: 0.50    Years: 10.00    Pack years: 5.00    Types: Cigarettes    Quit date: 1990    Years since quitting: 31.5   Smokeless tobacco: Never Used  Building services engineer Use: Never used  Substance and Sexual Activity   Alcohol use: Not Currently   Drug use: Never   Sexual activity: Not Currently

## 2020-01-05 NOTE — Addendum Note (Signed)
Addended by: Mardene Celeste B on: 01/05/2020 10:27 AM   Modules accepted: Orders

## 2020-01-25 ENCOUNTER — Other Ambulatory Visit: Payer: Self-pay

## 2020-01-25 ENCOUNTER — Ambulatory Visit
Admission: RE | Admit: 2020-01-25 | Discharge: 2020-01-25 | Disposition: A | Discharge status: Home / Self Care | Payer: Managed Care, Other (non HMO) | Source: Ambulatory Visit | Attending: Orthopaedic Surgery | Admitting: Orthopaedic Surgery

## 2020-01-25 DIAGNOSIS — M65352 Trigger finger, left little finger: Secondary | ICD-10-CM

## 2020-01-25 DIAGNOSIS — M65342 Trigger finger, left ring finger: Secondary | ICD-10-CM

## 2020-01-25 IMAGING — XA DG FLUORO GUIDE NDL PLC/BX
1 series · 1 of 1 positions shown · non-contrast
Comparison: none

CLINICAL DATA: Left wrist pain.

[Series 1: ortho standard · 1 of 1 slices shown]
[im 1/1]
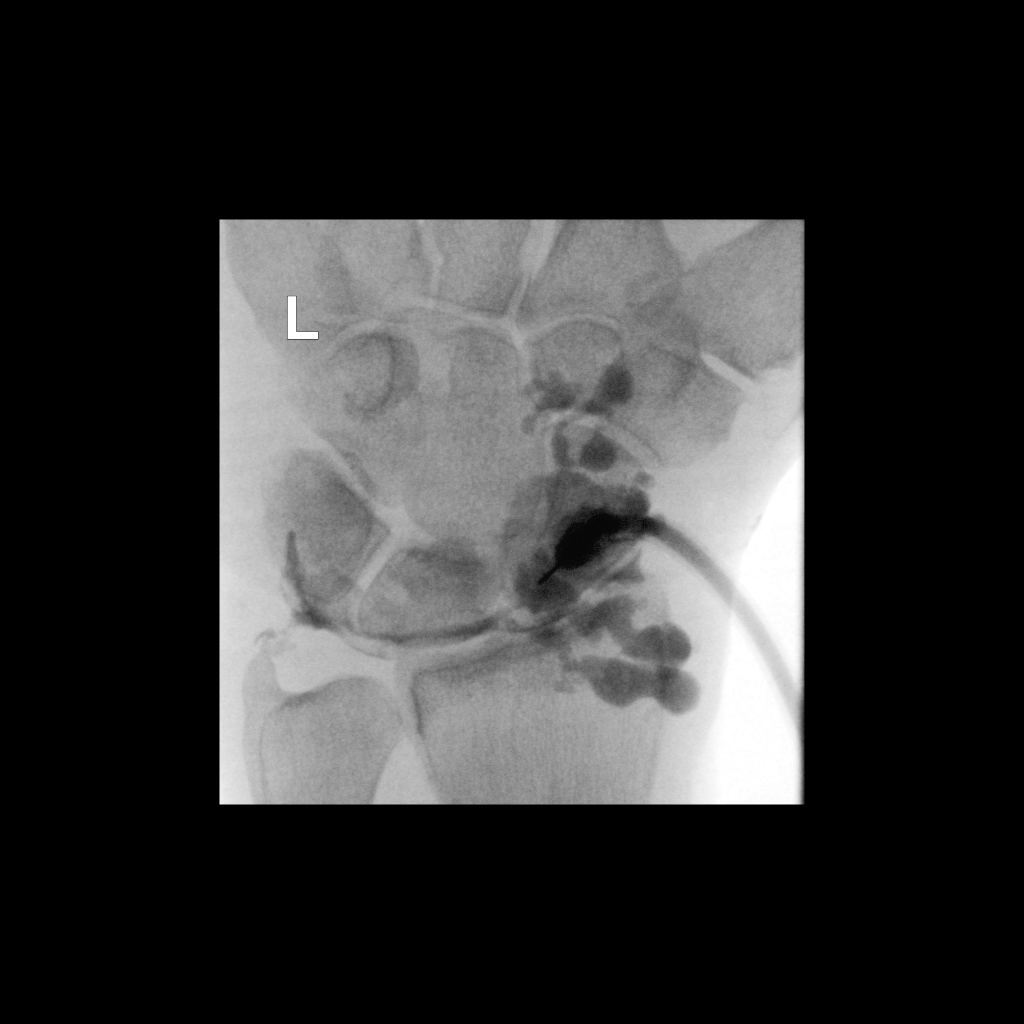

[1 of 1 positions shown; findings below may reference images not displayed]

FLUOROSCOPY TIME:  Radiation Exposure Index (as provided by the
fluoroscopic device): 0.1 mGy

Fluoroscopy Time:  6 seconds

Number of Acquired Images:  0

PROCEDURE:
The risks and benefits of the procedure were discussed with the
patient, and written informed consent was obtained. The patient
stated no history of allergy to contrast media. A formal timeout
procedure was performed with the patient according to departmental
protocol.

The patient was placed supine on the fluoroscopy table and the left
radiocarpal joint was identified under fluoroscopy. The skin
overlying the left radiocarpal joint was subsequently cleaned with
Betadine and a sterile drape was placed over the area of interest.
0.5 ml 1% Lidocaine was used to anesthetize the skin around the
needle insertion site.

A 25 gauge needle was inserted into the left radiocarpal joint under
fluoroscopy.

2 ml of gadolinium mixture (0.1 ml of Multihance mixed with 10 ml of
Isovue-M 200 contrast and 10 ml of sterile saline) were injected
into the left radiocarpal joint.

The needle was removed and hemostasis was achieved. The patient was
subsequently transferred to MRI for imaging.
IMPRESSION: Technically successful left wrist injection for MRI.

## 2020-01-25 IMAGING — MR MR WRIST*L* W/CM
6 series · 40 of 40 positions shown · non-contrast
Comparison: None.

CONTRAST:  See injection documentation.

CLINICAL DATA: Left wrist pain for 3-4 months.  No known injury.

EXAM:
MR ARTHROGRAM OF THE LEFT WRIST
TECHNIQUE: Multiplanar, multisequence MR imaging of the left wrist was
performed following the administration of intra-articular contrast.

[Series 3: T1 fat-sat · axial · 3.0mm · 0.20mm/px · z∈[-10,+56]mm · 7 of 20 slices shown]
[im 1/20]
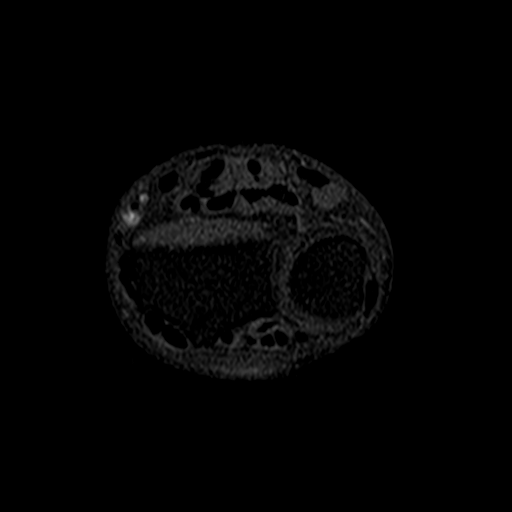
[im 4/20]
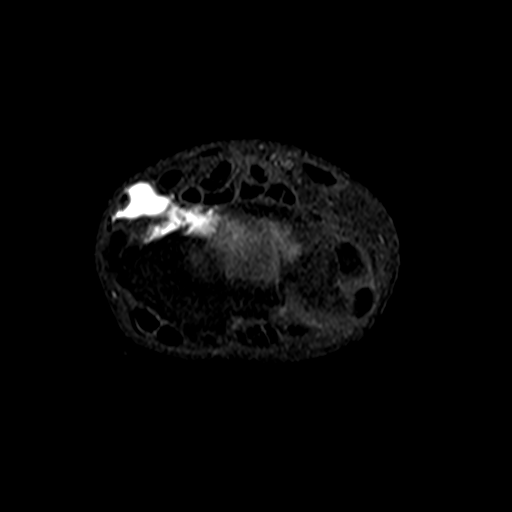
[im 7/20]
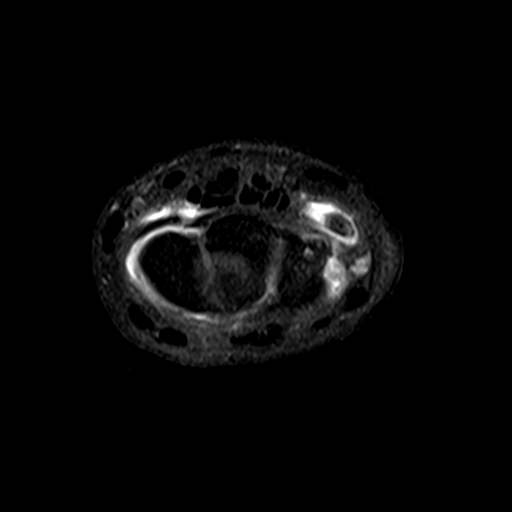
[im 10/20]
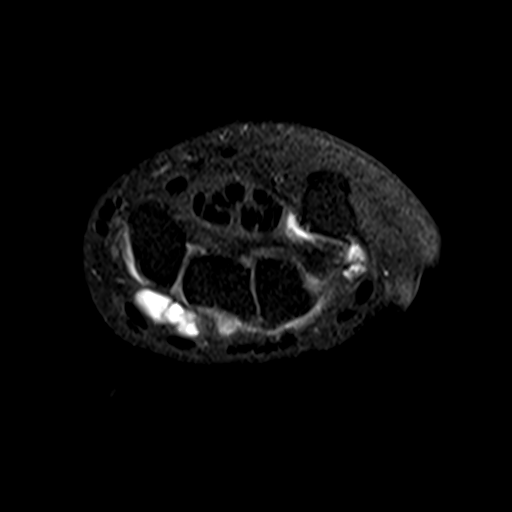
[im 13/20]
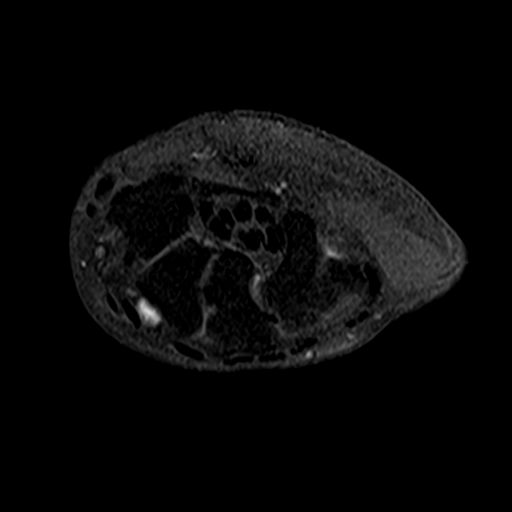
[im 16/20]
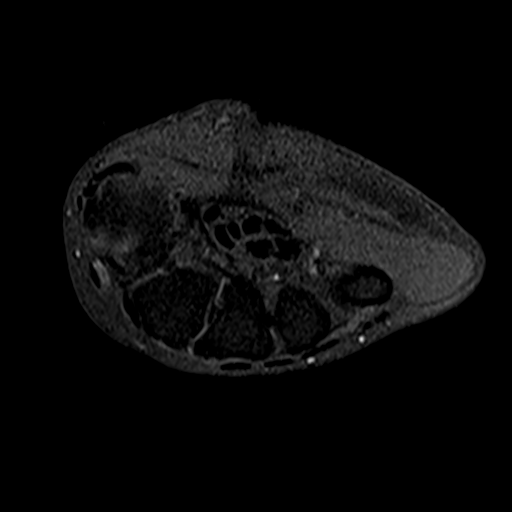
[im 20/20]
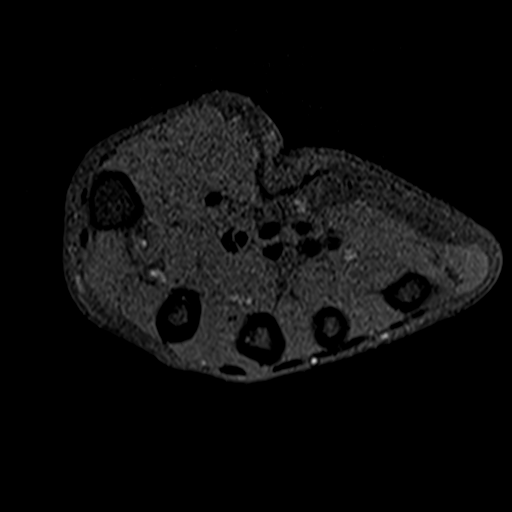

[Series 4: T2 fat-sat · axial · 3.0mm · 0.47mm/px · z∈[-4,+49]mm · 6 of 16 slices shown (1 of 2)]
[im 1/16]
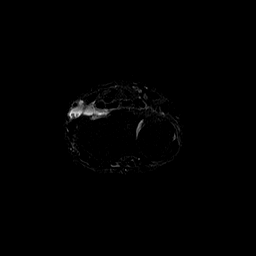
[im 4/16]
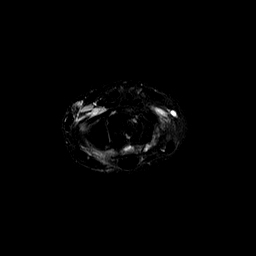
[im 7/16]
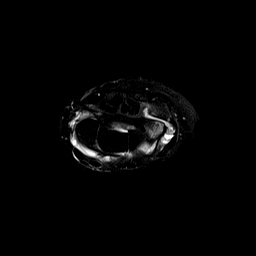
[im 10/16]
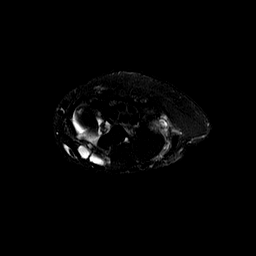
[im 13/16]
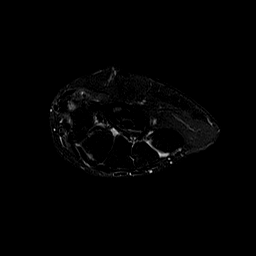
[im 16/16]
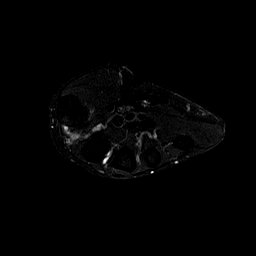

[Series 5: t1_tse_cor_fs · coronal · 3.0mm · 0.39mm/px · 6 of 16 slices shown]
[im 1/16]
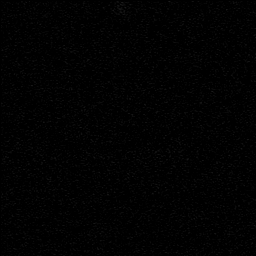
[im 4/16]
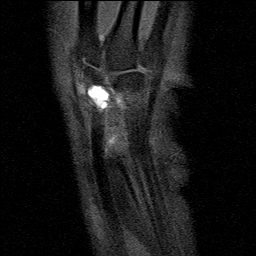
[im 7/16]
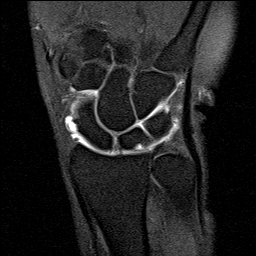
[im 10/16]
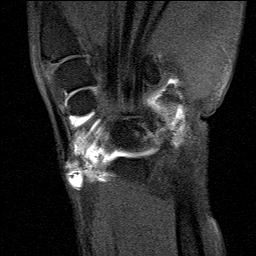
[im 13/16]
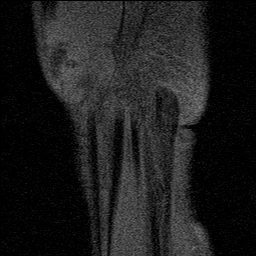
[im 16/16]
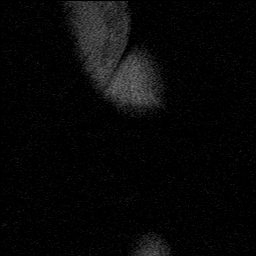

[Series 6: t1_tse_cor_no fs · coronal · 3.0mm · 0.39mm/px · 6 of 16 slices shown]
[im 1/16]
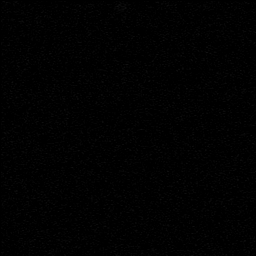
[im 4/16]
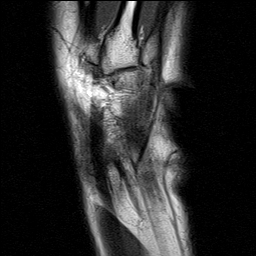
[im 7/16]
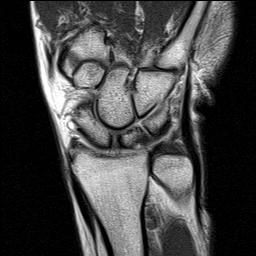
[im 10/16]
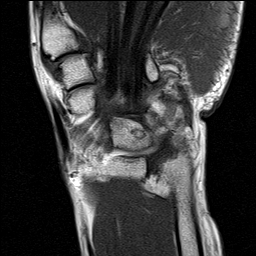
[im 13/16]
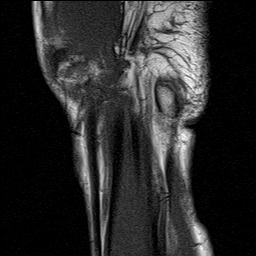
[im 16/16]
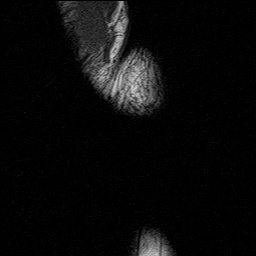

[Series 7: T2 fat-sat · coronal · 3.0mm · 0.39mm/px · 6 of 15 slices shown (2 of 2)]
[im 1/15]
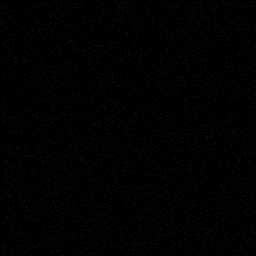
[im 3/15]
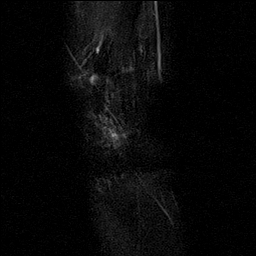
[im 6/15]
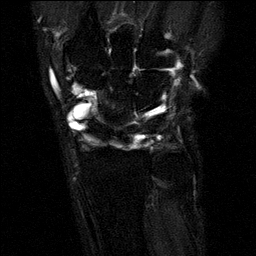
[im 9/15]
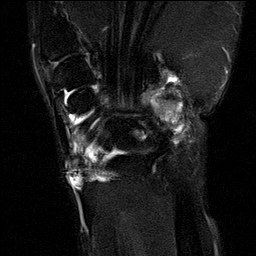
[im 12/15]
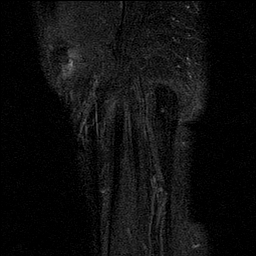
[im 15/15]
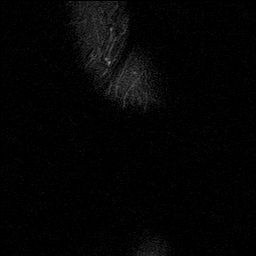

[Series 8: t1_tse_sag_fs · sagittal · 3.0mm · 0.39mm/px · 9 of 24 slices shown]
[im 1/24]
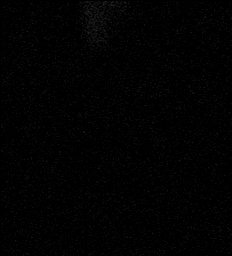
[im 3/24]
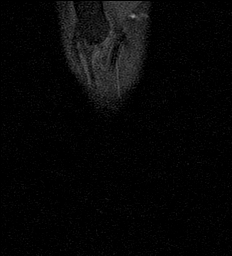
[im 6/24]
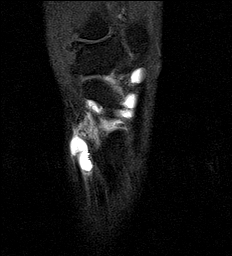
[im 9/24]
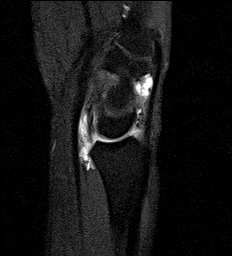
[im 12/24]
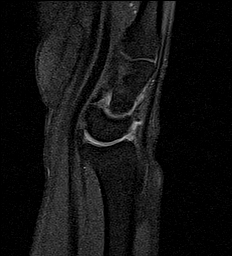
[im 15/24]
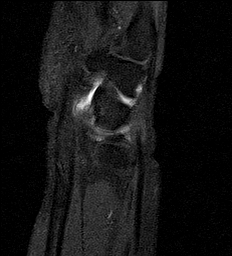
[im 18/24]
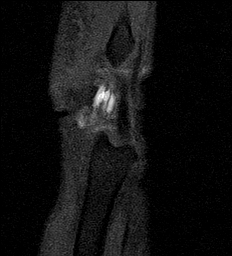
[im 21/24]
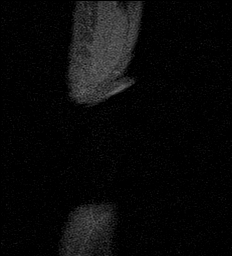
[im 24/24]
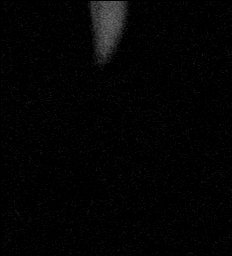

[40 of 40 positions shown; findings below may reference images not displayed]

FINDINGS: Ligaments: Mild heterogeneity involving the dorsal band of the
scapholunate ligament (series 4, image 13) which may reflect
degeneration or partial tear. High T1 signal within the volar aspect
of the lunotriquetral ligament also suspicious for a partial tear
(series 5, image 9). Injected contrast does extend into the middle
carpal row compatible with at least a full-thickness ligamentous
perforation.

Triangular fibrocartilage: Intact TFCC.

Tendons: Mild tenosynovitis of the extensor pollicis longus tendon
at the site of the crossover of the second extensor compartment
tendons (series 4, images 6-8). Mild tendinosis of the extensor
carpi radialis longus and brevis at this location. Remaining
extensor tendons are intact. Flexor tendons are within normal
limits.

Carpal tunnel/median nerve: Normal carpal tunnel. Normal median
nerve.

Guyon's canal: Normal.

Joint/cartilage: Radiocarpal joint is adequately distended with
injected contrast. No evidence of synovitis. Mild-to-moderate
arthropathy of the first CMC joint. No significant intercarpal
arthropathy.

Bones/carpal alignment: Small subchondral cyst or intraosseous
ganglion within the triquetrum. No evidence of acute fracture. No
malalignment.

Other: None.
IMPRESSION: 1. Findings of distal intersection syndrome.
2. Mild heterogeneity involving the scapholunate and lunotriquetral
ligaments which may reflect degeneration or partial tear. Injected
contrast does extend into the middle carpal row compatible with at
least a full-thickness ligamentous perforation. No high-grade
ligamentous tear.
3. Mild to moderate arthropathy of the first CMC joint.

## 2020-01-25 MED ORDER — IOPAMIDOL (ISOVUE-M 200) INJECTION 41%
2.0000 mL | Freq: Once | INTRAMUSCULAR | Status: AC
  Administered 2020-01-25: 2 mL via INTRA_ARTICULAR

## 2020-01-27 ENCOUNTER — Encounter: Payer: Self-pay | Admitting: Orthopaedic Surgery

## 2020-01-27 ENCOUNTER — Other Ambulatory Visit: Payer: Self-pay

## 2020-01-27 ENCOUNTER — Ambulatory Visit (INDEPENDENT_AMBULATORY_CARE_PROVIDER_SITE_OTHER): Payer: Managed Care, Other (non HMO) | Admitting: Orthopaedic Surgery

## 2020-01-27 VITALS — Ht 67.0 in | Wt 226.0 lb

## 2020-01-27 DIAGNOSIS — M25532 Pain in left wrist: Secondary | ICD-10-CM

## 2020-01-28 NOTE — Progress Notes (Signed)
Office Visit Note   Patient: Theresa Case           Date of Birth: 06/27/1961           MRN: [id number] Visit Date: 01/27/2020              Requested by: Darryl Lent, PA-C [address] [place],  [place] [address] PCP: Darryl Lent, PA-C   Assessment & Plan: Visit Diagnoses:  1. Pain in left wrist     Plan: MRI of the left wrist is inconclusive for ulnar-sided wrist pain.  Given the inconclusive findings we will refer to hand surgery for further evaluation and treatment.  Follow-up as needed.  Follow-Up Instructions: Return if symptoms worsen or fail to improve.   Orders:  Orders Placed This Encounter  Procedures  . Ambulatory referral to Orthopedic Surgery   No orders of the defined types were placed in this encounter.     Procedures: No procedures performed   Clinical Data: No additional findings.   Subjective: Chief Complaint  Patient presents with  . Left Wrist - Pain    MRI review    [name] returns today for MRI left wrist review.  Continues to have chronic ulnar-sided wrist pain.   Review of Systems   Objective: Vital Signs: Ht 5\' 7"  (1.702 m)   Wt (!) 226 lb (102.5 kg)   BMI 35.40 kg/m   Physical Exam  Ortho Exam Left wrist exam is unchanged. Specialty Comments:  No specialty comments available.  Imaging: No results found.   PMFS History: Patient Active Problem List   Diagnosis Date Noted  . Trigger little finger of left hand 01/05/2020  . Stenosing tenosynovitis of thumb 09/01/2019  . Trigger thumb, left thumb 08/16/2019  . Trigger finger, left ring finger 08/16/2019  . Anaphylactic reaction due to food 09/17/2018  . Acute hepatitis 09/14/2018  . Essential hypertension 09/14/2018  . Pseudotumor cerebri 09/14/2018  . Hypothyroidism 12/10/2014  . Obesity, Class II, BMI 35-39.9, with comorbidity 12/10/2014  . OSA (obstructive sleep apnea) 12/10/2014   Past Medical History:  Diagnosis Date  . Acute hepatitis 09/2018   . Arthritis    "left knee" (07/19/2018)  . GERD (gastroesophageal reflux disease)    hx (07/19/2018)  . Headache    "q 4 months or so; from my pseudotumor" (07/19/2018)  . Heart murmur    "grew out of it" (07/19/2018)  . History of kidney stones   . Hypertension   . Pneumonia    "once" (07/19/2018)  . PONV (postoperative nausea and vomiting)   . Pseudotumor cerebri     Family History  Problem Relation Age of Onset  . Hypertension Mother   . Diabetes Mother   . Cancer Father   . Hyperlipidemia Brother     Past Surgical History:  Procedure Laterality Date  . ABDOMINAL HYSTERECTOMY  1999  . BAND HEMORRHOIDECTOMY  2019  . 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 CHOLECYSTECTOMY N/A 07/20/2018   Procedure: LAPAROSCOPIC CHOLECYSTECTOMY WITH INTRAOPERATIVE CHOLANGIOGRAM;  Surgeon: 07/22/2018, MD;  Location: [facility];  Service: General;  Laterality: N/A;  . COLECTOMY Left ~ 2010   2.5 feet  . CYST EXCISION Left 1988   "bleeding cyst in palm of my hand" (07/19/2018)  . CYST EXCISION Left 1969   "behind my ear"  . TUBAL LIGATION  1994   Social History   Occupational History  . Not on file  Tobacco Use  . Smoking status: Former Smoker    Packs/day: 0.50    Years: 10.00    Pack years: 5.00    Types: Cigarettes    Quit date: 1990    Years since quitting: 31.5  . Smokeless tobacco: Never Used  Vaping Use  . Vaping Use: Never used  Substance and Sexual Activity  . Alcohol use: Not Currently  . Drug use: Never  . Sexual activity: Not Currently

## 2023-04-06 ENCOUNTER — Other Ambulatory Visit: Payer: Self-pay

## 2023-04-06 ENCOUNTER — Encounter (HOSPITAL_BASED_OUTPATIENT_CLINIC_OR_DEPARTMENT_OTHER): Payer: Self-pay | Admitting: Emergency Medicine

## 2023-04-06 ENCOUNTER — Emergency Department (HOSPITAL_BASED_OUTPATIENT_CLINIC_OR_DEPARTMENT_OTHER): Payer: Managed Care, Other (non HMO)

## 2023-04-06 ENCOUNTER — Emergency Department (HOSPITAL_BASED_OUTPATIENT_CLINIC_OR_DEPARTMENT_OTHER)
Admission: EM | Admit: 2023-04-06 | Discharge: 2023-04-06 | Disposition: A | Discharge status: Home / Self Care | Payer: Managed Care, Other (non HMO) | Attending: Emergency Medicine | Admitting: Emergency Medicine

## 2023-04-06 DIAGNOSIS — R0789 Other chest pain: Secondary | ICD-10-CM

## 2023-04-06 DIAGNOSIS — R079 Chest pain, unspecified: Secondary | ICD-10-CM | POA: Insufficient documentation

## 2023-04-06 DIAGNOSIS — Z7982 Long term (current) use of aspirin: Secondary | ICD-10-CM | POA: Insufficient documentation

## 2023-04-06 LAB — BASIC METABOLIC PANEL
Anion gap: 14 (ref 5–15)
BUN: 10 mg/dL (ref 6–20)
CO2: 25 mmol/L (ref 22–32)
Calcium: 9.6 mg/dL (ref 8.9–10.3)
Chloride: 102 mmol/L (ref 98–111)
Creatinine, Ser: 0.72 mg/dL (ref 0.44–1.00)
GFR, Estimated: >60 mL/min (ref >60)
Glucose, Bld: 104 mg/dL — ABNORMAL HIGH (ref 70–99)
Potassium: 3.1 mmol/L — ABNORMAL LOW (ref 3.5–5.1)
Sodium: 141 mmol/L (ref 135–145)

## 2023-04-06 LAB — CBC
HCT: 35.7 % — ABNORMAL LOW (ref 36.0–46.0)
Hemoglobin: 12.2 g/dL (ref 12.0–15.0)
MCH: 30.9 pg (ref 26.0–34.0)
MCHC: 34.2 g/dL (ref 30.0–36.0)
MCV: 90.4 fL (ref 80.0–100.0)
Platelets: 319 10*3/uL (ref 150–400)
RBC: 3.95 MIL/uL (ref 3.87–5.11)
RDW: 13.1 % (ref 11.5–15.5)
WBC: 5.4 10*3/uL (ref 4.0–10.5)
nRBC: 0 % (ref 0.0–0.2)

## 2023-04-06 LAB — D-DIMER, QUANTITATIVE: D-Dimer, Quant: 0.42 ug{FEU}/mL (ref 0.00–0.50)

## 2023-04-06 LAB — TROPONIN I (HIGH SENSITIVITY)
Troponin I (High Sensitivity): 3 ng/L (ref <18)
Troponin I (High Sensitivity): 4 ng/L (ref <18)

## 2023-04-06 LAB — MAGNESIUM: Magnesium: 2.1 mg/dL (ref 1.7–2.4)

## 2023-04-06 MED ORDER — POTASSIUM CHLORIDE 10 MEQ/100ML IV SOLN
10.0000 meq | Freq: Once | INTRAVENOUS | Status: AC
  Administered 2023-04-06: 10 meq via INTRAVENOUS
  Filled 2023-04-06: qty 100

## 2023-04-06 MED ORDER — KETOROLAC TROMETHAMINE 30 MG/ML IJ SOLN
30.0000 mg | Freq: Once | INTRAMUSCULAR | Status: AC
  Administered 2023-04-06: 30 mg via INTRAVENOUS
  Filled 2023-04-06: qty 1

## 2023-04-06 MED ORDER — SODIUM CHLORIDE 0.9 % IV BOLUS
500.0000 mL | Freq: Once | INTRAVENOUS | Status: AC
  Administered 2023-04-06: 500 mL via INTRAVENOUS

## 2023-04-06 NOTE — ED Triage Notes (Signed)
Left chest pressure radiating to neck and left jaw. Shortness of breath . Denies NV .  Lightheadedness.  Went to [facility] , no EKG done . Hx hypokalemia .

## 2023-04-06 NOTE — Discharge Instructions (Signed)
You have been seen and discharged from the emergency department.  Your heart and lung workup were normal.  However you still need to be seen by cardiology.  Follow-up with your primary provider for further evaluation and further care. Take home medications as prescribed. If you have any worsening symptoms or further concerns for your health please return to an emergency department for further evaluation.

## 2023-04-06 NOTE — ED Provider Notes (Signed)
[place] [facility] Provider Note   CSN: [id number] Arrival date & time: 04/06/23  1612     History  Chief Complaint  Patient presents with   Chest Pain    Annaliz Righter is a 62 y.o. female.  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  Patient states that this has been going on for the past couple days, associated with sensation of palpitations.  Denies any active shortness of breath or cough.  Pain does not radiate through to her back.  She admits to having chronically low potassium is currently taking magnesium supplements.  She had an episode of lightheadedness that came in for evaluation.  Home Medications Prior to Admission medications   Medication Sig Start Date End Date Taking? Authorizing Provider  acetaminophen (TYLENOL) 500 MG tablet Take 2 tablets (1,000 mg total) by mouth every 6 (six) hours as needed. Patient taking differently: Take 1,000 mg by mouth every 6 (six) hours as needed for mild pain or fever.  07/21/18   Maczis, Elmer Sow, PA-C  aspirin EC 81 MG tablet Take 1 tablet (81 mg total) by mouth daily. 02/26/18   Pricilla Riffle, MD  EPINEPHrine 0.3 mg/0.3 mL IJ SOAJ injection Inject 0.3 mLs (0.3 mg total) into the muscle as needed (anaphylaxis). 09/18/18   Reymundo Poll, MD  ergocalciferol (VITAMIN D2) 50000 units capsule Take 50,000 Units by mouth once a week.    [provider]  hydrochlorothiazide (HYDRODIURIL) 25 MG tablet Take 25 mg by mouth daily.    [provider]  HYDROcodone-acetaminophen (NORCO) 5-325 MG tablet Take 1-2 tablets by mouth 3 (three) times daily as needed for moderate pain. 08/24/19   Cristie Hem, PA-C  methylPREDNISolone (MEDROL DOSEPAK) 4 MG TBPK tablet Use as directed 10/13/19   Tarry Kos, MD  Multiple Vitamins-Minerals (MULTIVITAMIN WITH MINERALS) tablet  Take 1 tablet by mouth daily.    [provider]  ondansetron (ZOFRAN) 4 MG tablet Take 1 tablet (4 mg total) by mouth every 8 (eight) hours as needed for nausea or vomiting. 08/24/19   Cristie Hem, PA-C      Allergies    Cephalexin, Cephalosporins, Magnesium, Magnesium carbonate, Magnesium trisilicate, Magnesium-containing compounds, Strawberry extract, Verapamil, Aleve [naproxen], Codeine, Methylprednisolone, Naproxen sodium, Tape, and Tramadol    Review of Systems   Review of Systems  Constitutional:  Positive for fatigue. Negative for fever.  Respiratory:  Positive for chest tightness. Negative for shortness of breath.   Cardiovascular:  Positive for chest pain and palpitations. Negative for leg swelling.  Gastrointestinal:  Negative for abdominal pain, diarrhea and vomiting.  Skin:  Negative for rash.  Neurological:  Negative for headaches.    Physical Exam Updated Vital Signs BP 131/87   Pulse 60   Temp 98.1 F (36.7 C)   Resp 19   Wt 79.8 kg   SpO2 98%   BMI 27.57 kg/m  Physical Exam Vitals and nursing note reviewed.  Constitutional:      General: She is not in acute distress.    Appearance: Normal appearance.  HENT:     Head: Normocephalic.     Mouth/Throat:     Mouth: Mucous membranes are moist.  Cardiovascular:     Rate and Rhythm: Normal rate.  Pulmonary:     Effort: Pulmonary effort is normal. No respiratory distress.  Breath sounds: No decreased breath sounds.  Abdominal:     Palpations: Abdomen is soft.     Tenderness: There is no abdominal tenderness.  Musculoskeletal:     Right lower leg: No edema.     Left lower leg: No edema.  Skin:    General: Skin is warm.  Neurological:     Mental Status: She is alert and oriented to person, place, and time. Mental status is at baseline.  Psychiatric:        Mood and Affect: Mood normal.     ED Results / Procedures / Treatments   Labs (all labs ordered are listed, but only abnormal  results are displayed) Labs Reviewed  BASIC METABOLIC PANEL - Abnormal; Notable for the following components:      Result Value   Potassium 3.1 (*)    Glucose, Bld 104 (*)    All other components within normal limits  CBC - Abnormal; Notable for the following components:   HCT 35.7 (*)    All other components within normal limits  MAGNESIUM  TROPONIN I (HIGH SENSITIVITY)  TROPONIN I (HIGH SENSITIVITY)    EKG EKG Interpretation Date/Time:  Monday April 06 2023 16:45:18 EDT Ventricular Rate:  77 PR Interval:  165 QRS Duration:  92 QT Interval:  399 QTC Calculation: 452 R Axis:   38  Text Interpretation: Sinus rhythm Atrial premature complex similar to previous Confirmed by Coralee Pesa [phone]) on 04/06/2023 4:52:31 PM  Radiology DG Chest 2 View  Result Date: 04/06/2023 CLINICAL DATA:  Chest pain and shortness of breath for 2 days, getting worse. EXAM: CHEST - 2 VIEW COMPARISON:  07/19/2018 FINDINGS: The heart size and mediastinal contours are within normal limits. Both lungs are clear. The visualized skeletal structures are unremarkable. IMPRESSION: No active cardiopulmonary disease. Electronically Signed   By: Burman Nieves M.D.   On: 04/06/2023 20:02    Procedures Procedures    Medications Ordered in ED Medications  potassium chloride 10 mEq in 100 mL IVPB (0 mEq Intravenous Stopped 04/06/23 1936)  sodium chloride 0.9 % bolus 500 mL (0 mLs Intravenous Stopped 04/06/23 1936)    ED Course/ Medical Decision Making/ A&P                                 Medical Decision Making Amount and/or Complexity of Data Reviewed Labs: ordered. Radiology: ordered.  Risk Prescription drug management.   62 year old female presents emergency department left-sided chest heaviness.  There was associated shortness of breath prior but this is resolved.  There was some radiation to the left side of the neck.  She has history of PVCs but no CAD.  Vitals are normal and stable on  arrival.  EKG shows no concerning ischemic changes.  Chest x-ray is unremarkable.  Blood work is reassuring with 2 negative troponins with no delta.  D-dimer is negative.  Doubt PE/dissection.  After dose of medicine she feels improved.  Is been a couple years since has been evaluated by cardiology but she is now endorsing frequent symptomatic palpitations.  Will plan for ambulatory referral for cardiology for further evaluation.  Patient at this time appears safe and stable for discharge and close outpatient follow up. Discharge plan and strict return to ED precautions discussed, patient verbalizes understanding and agreement.        Final Clinical Impression(s) / ED Diagnoses Final diagnoses:  None    Rx / DC Orders  ED Discharge Orders     None         Rozelle Logan, DO 04/06/23 2326
